# Patient Record
Sex: Male | Born: 1955
Health system: Southern US, Community
[De-identification: ages and names within clinical notes are randomized; demographics above are authoritative.]

## PROBLEM LIST (undated history)

## (undated) DIAGNOSIS — E785 Hyperlipidemia, unspecified: Secondary | ICD-10-CM

## (undated) DIAGNOSIS — N529 Male erectile dysfunction, unspecified: Secondary | ICD-10-CM

## (undated) DIAGNOSIS — J329 Chronic sinusitis, unspecified: Secondary | ICD-10-CM

## (undated) DIAGNOSIS — R739 Hyperglycemia, unspecified: Secondary | ICD-10-CM

## (undated) DIAGNOSIS — F329 Major depressive disorder, single episode, unspecified: Secondary | ICD-10-CM

## (undated) DIAGNOSIS — Z889 Allergy status to unspecified drugs, medicaments and biological substances status: Secondary | ICD-10-CM

## (undated) DIAGNOSIS — I639 Cerebral infarction, unspecified: Secondary | ICD-10-CM

## (undated) DIAGNOSIS — M21619 Bunion of unspecified foot: Secondary | ICD-10-CM

## (undated) DIAGNOSIS — J189 Pneumonia, unspecified organism: Secondary | ICD-10-CM

## (undated) HISTORY — DX: Hyperlipidemia, unspecified: E78.5

## (undated) HISTORY — DX: Male erectile dysfunction, unspecified: N52.9

## (undated) HISTORY — DX: Pneumonia, unspecified organism: J18.9

## (undated) HISTORY — PX: SHOULDER ARTHROSCOPY WITH BICEPS TENDON REPAIR: SHX5674

## (undated) HISTORY — PX: KNEE ARTHROSCOPY: SHX127

## (undated) HISTORY — DX: Chronic sinusitis, unspecified: J32.9

## (undated) HISTORY — DX: Hyperglycemia, unspecified: R73.9

## (undated) HISTORY — DX: Bunion of unspecified foot: M21.619

## (undated) HISTORY — DX: Allergy status to unspecified drugs, medicaments and biological substances: Z88.9

## (undated) HISTORY — PX: ADENOIDECTOMY: SUR15

## (undated) HISTORY — DX: Major depressive disorder, single episode, unspecified: F32.9

## (undated) HISTORY — PX: TONSILLECTOMY: SUR1361

## (undated) HISTORY — PX: APPENDECTOMY: SHX54

---

## 2005-04-30 ENCOUNTER — Ambulatory Visit (HOSPITAL_COMMUNITY): Admission: RE | Admit: 2005-04-30 | Discharge: 2005-04-30 | Payer: Self-pay | Admitting: Specialist

## 2012-08-04 ENCOUNTER — Other Ambulatory Visit: Payer: Self-pay | Admitting: Physical Medicine and Rehabilitation

## 2012-08-04 ENCOUNTER — Encounter: Payer: Self-pay | Admitting: Physical Medicine and Rehabilitation

## 2012-08-04 ENCOUNTER — Encounter: Payer: Self-pay | Admitting: *Deleted

## 2012-08-04 ENCOUNTER — Inpatient Hospital Stay (HOSPITAL_COMMUNITY)
Admission: RE | Admit: 2012-08-04 | Discharge: 2012-08-11 | DRG: 945 | Disposition: A | Discharge status: Home / Self Care | Payer: 59 | Source: Ambulatory Visit | Attending: Physical Medicine & Rehabilitation | Admitting: Physical Medicine & Rehabilitation

## 2012-08-04 DIAGNOSIS — I10 Essential (primary) hypertension: Secondary | ICD-10-CM

## 2012-08-04 DIAGNOSIS — I6330 Cerebral infarction due to thrombosis of unspecified cerebral artery: Secondary | ICD-10-CM

## 2012-08-04 DIAGNOSIS — I639 Cerebral infarction, unspecified: Secondary | ICD-10-CM

## 2012-08-04 DIAGNOSIS — Z5189 Encounter for other specified aftercare: Principal | ICD-10-CM

## 2012-08-04 DIAGNOSIS — G8190 Hemiplegia, unspecified affecting unspecified side: Secondary | ICD-10-CM | POA: Diagnosis present

## 2012-08-04 DIAGNOSIS — I6350 Cerebral infarction due to unspecified occlusion or stenosis of unspecified cerebral artery: Secondary | ICD-10-CM | POA: Diagnosis present

## 2012-08-04 DIAGNOSIS — E785 Hyperlipidemia, unspecified: Secondary | ICD-10-CM

## 2012-08-04 LAB — COMPREHENSIVE METABOLIC PANEL
BUN: 12 mg/dL (ref 6–23)
Calcium: 9.6 mg/dL (ref 8.4–10.5)
GFR calc Af Amer: 82 mL/min — ABNORMAL LOW (ref >90)
Glucose, Bld: 93 mg/dL (ref 70–99)
Sodium: 138 mEq/L (ref 135–145)
Total Protein: 7.3 g/dL (ref 6.0–8.3)

## 2012-08-04 LAB — CBC
HCT: 46.5 % (ref 39.0–52.0)
Hemoglobin: 16 g/dL (ref 13.0–17.0)
WBC: 7.8 10*3/uL (ref 4.0–10.5)

## 2012-08-04 MED ORDER — FLEET ENEMA 7-19 GM/118ML RE ENEM: 1.0000 | ENEMA | Freq: Once | RECTAL | Status: AC | PRN

## 2012-08-04 MED ORDER — ASPIRIN EC 325 MG PO TBEC
325.0000 mg | DELAYED_RELEASE_TABLET | Freq: Every day | ORAL | Status: DC
  Administered 2012-08-05 – 2012-08-11 (×7): 325 mg via ORAL
  Filled 2012-08-04 (×8): qty 1

## 2012-08-04 MED ORDER — ROSUVASTATIN CALCIUM 10 MG PO TABS
10.0000 mg | ORAL_TABLET | Freq: Every day | ORAL | Status: DC
  Administered 2012-08-04 – 2012-08-10 (×7): 10 mg via ORAL
  Filled 2012-08-04 (×8): qty 1

## 2012-08-04 MED ORDER — TRAZODONE HCL 50 MG PO TABS
25.0000 mg | ORAL_TABLET | Freq: Every evening | ORAL | Status: DC | PRN
  Administered 2012-08-06: 25 mg via ORAL
  Administered 2012-08-06 – 2012-08-09 (×4): 50 mg via ORAL
  Filled 2012-08-04 (×5): qty 1

## 2012-08-04 MED ORDER — PROCHLORPERAZINE 25 MG RE SUPP
12.5000 mg | Freq: Four times a day (QID) | RECTAL | Status: DC | PRN
  Filled 2012-08-04: qty 1

## 2012-08-04 MED ORDER — GUAIFENESIN-DM 100-10 MG/5ML PO SYRP: 5.0000 mL | ORAL_SOLUTION | Freq: Four times a day (QID) | ORAL | Status: DC | PRN

## 2012-08-04 MED ORDER — PROCHLORPERAZINE MALEATE 5 MG PO TABS
5.0000 mg | ORAL_TABLET | Freq: Four times a day (QID) | ORAL | Status: DC | PRN
  Filled 2012-08-04: qty 2

## 2012-08-04 MED ORDER — BISACODYL 10 MG RE SUPP: 10.0000 mg | Freq: Every day | RECTAL | Status: DC | PRN

## 2012-08-04 MED ORDER — PROCHLORPERAZINE EDISYLATE 5 MG/ML IJ SOLN
5.0000 mg | Freq: Four times a day (QID) | INTRAMUSCULAR | Status: DC | PRN
  Filled 2012-08-04: qty 2

## 2012-08-04 MED ORDER — NON FORMULARY: 10.0000 mg | Freq: Every day | Status: DC

## 2012-08-04 MED ORDER — DIPHENHYDRAMINE HCL 12.5 MG/5ML PO ELIX
12.5000 mg | ORAL_SOLUTION | Freq: Four times a day (QID) | ORAL | Status: DC | PRN
  Administered 2012-08-06: 25 mg via ORAL
  Filled 2012-08-04 (×2): qty 10

## 2012-08-04 MED ORDER — ALUM & MAG HYDROXIDE-SIMETH 200-200-20 MG/5ML PO SUSP: 30.0000 mL | ORAL | Status: DC | PRN

## 2012-08-04 MED ORDER — ACETAMINOPHEN 325 MG PO TABS
325.0000 mg | ORAL_TABLET | ORAL | Status: DC | PRN
  Administered 2012-08-05 – 2012-08-10 (×9): 650 mg via ORAL
  Filled 2012-08-04 (×10): qty 2

## 2012-08-04 MED ORDER — FLUOXETINE HCL 20 MG PO CAPS
20.0000 mg | ORAL_CAPSULE | Freq: Every day | ORAL | Status: DC
  Administered 2012-08-05 – 2012-08-11 (×7): 20 mg via ORAL
  Filled 2012-08-04 (×9): qty 1

## 2012-08-04 MED ORDER — LISINOPRIL 10 MG PO TABS
10.0000 mg | ORAL_TABLET | Freq: Every day | ORAL | Status: DC
  Administered 2012-08-05 – 2012-08-11 (×7): 10 mg via ORAL
  Filled 2012-08-04 (×9): qty 1

## 2012-08-04 MED ORDER — ENOXAPARIN SODIUM 40 MG/0.4ML ~~LOC~~ SOLN
40.0000 mg | SUBCUTANEOUS | Status: DC
  Administered 2012-08-05 – 2012-08-11 (×7): 40 mg via SUBCUTANEOUS
  Filled 2012-08-04 (×8): qty 0.4

## 2012-08-04 MED ORDER — POLYETHYLENE GLYCOL 3350 17 G PO PACK
17.0000 g | PACK | Freq: Every day | ORAL | Status: DC | PRN
  Filled 2012-08-04: qty 1

## 2012-08-04 NOTE — Plan of Care (Addendum)
Overall Plan of Care [facility]) Patient Details Name: Ricardo Murphy MRN: [id number] DOB: 09-04-1955  Diagnosis: Rehab for Right frontoparietal infarct  Primary Diagnosis:    CVA (cerebral infarction) Co-morbidities: recent pneumonia, hx bicipital tendon repair  Functional Problem List  Patient demonstrates impairments in the following areas: Balance, Medication Management, Motor, Pain, Sensory  and Skin Integrity  Basic ADL's: bathing, dressing and toileting Advanced ADL's: simple meal preparation and light housekeeping  Transfers:  bed mobility, bed to chair, toilet, tub/shower, car and furniture Locomotion:  ambulation, wheelchair mobility and stairs  Additional Impairments:  Functional use of upper extremity  Anticipated Outcomes Item Anticipated Outcome  Eating/Swallowing  independent  Basic self-care  Mod I  Tolieting  Mod I  Bowel/Bladder  Pt continent of bowel and bladder  Transfers  Mod I  Locomotion  Supervision  Communication  Mod I  Cognition  Mod I  Pain  Pt denies pain  Safety/Judgment    Other  Skin will remain intact   Therapy Plan: PT Frequency: 1-2 X/day, 60-90 minutes OT Frequency: 1-2 X/day, 60-90 minutes;5 out of 7 days     Team Interventions: Item RN PT OT SLP SW TR Other  Self Care/Advanced ADL Retraining  x x      Neuromuscular Re-Education  x x      Therapeutic Activities  x x      UE/LE Strength Training/ROM  x x      UE/LE Coordination Activities  x x      Visual/Perceptual Remediation/Compensation         DME/Adaptive Equipment Instruction  x x      Therapeutic Exercise  x x      Balance/Vestibular Training  x x      Patient/Family Education  x x      Cognitive Remediation/Compensation    x     Functional Mobility Training  x x      Ambulation/Gait Training  x       Museum/gallery curator  x       Wheelchair Propulsion/Positioning  x       Functional Statistician  x       Community Reintegration  x        Dysphagia/Aspiration Film/video editor    x     Bladder Management         Bowel Management         Disease Management/Prevention  x       Pain Management x x       Medication Management x        Skin Care/Wound Management         Splinting/Orthotics  x       Discharge Planning  x x      Psychosocial Support x x x                         Team Discharge Planning: Destination:  Home Projected Follow-up:  PT, OT and Home Health Projected Equipment Needs:  Tub Engineer, building services Patient/family involved in discharge planning:  Yes  MD ELOS: 2wks Medical Rehab Prognosis:  Good Assessment: 56 yo male with acute CVA now requiring 24/7 rehab RN and MD as well as CIR level PT,OT,SLP

## 2012-08-04 NOTE — H&P (Deleted)
  Physical Medicine and Rehabilitation Admission H&P    CC: Right sided weakness  HPI:  Mr. Ricardo Murphy is a 56 year old male with history of HTN who was admitted to [facility] on 08/02/12 with right sided weakness, difficulty speaking and numbness right side. MRI brain done revealing acute infarct right frontoparietal region. Carotid dopplers without stenosis. He was started on ASA for CVA prophylaxis. He continues to have right hemiparesis with sensory deficits and speech has improved. Patient was advanced to D3 thin liquids. He was noted to have elevated BP and ace added for control. Therapies ongoing and CIR recommended for progressive therapies.   Review of Systems  HENT: Negative for neck pain.   Eyes: Negative for blurred vision and double vision.  Respiratory: Negative for cough and shortness of breath.   Cardiovascular: Negative for chest pain and palpitations.  Gastrointestinal: Negative for heartburn, nausea and constipation.  Genitourinary: Negative for urgency and frequency.  Musculoskeletal: Negative for myalgias and back pain.  Neurological: Positive for sensory change, focal weakness and headaches.  Psychiatric/Behavioral: The patient has insomnia.        Social History:  Married. Independent PTA.  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 He  does not smoke. He drinks a beer every couple of months. He does not use any illicit drugs. He does not have any smokeless tobacco history on file.  Allergies  Allergen Reactions  . Atorvastatin Hives  . Zocor (Simvastatin) Hives     Home: Lives in one level home with   STE.   Functional History: Independent and working PTA.  Functional Status:  Mobility: CGA for sitting at EOB Mod assist X 2 for standing and weight bearing RLE/     ADL:    Cognition:         Physical Exam  Nursing note and vitals reviewed. Constitutional: He is oriented to person, place, and time. He appears well-developed and well-nourished.  HENT:  Head:  Normocephalic and atraumatic.  Neck: Normal range of motion. Neck supple.  Cardiovascular: Normal rate and regular rhythm.   Pulmonary/Chest: Effort normal and breath sounds normal.  Abdominal: Soft. Bowel sounds are normal.  Neurological: He is alert and oriented to person, place, and time.       Speech clear. Follows  Commands without difficulty. Right hemiparesis with sensory deficits.        Post Admission Physician Evaluation: 4. Neuropsych: This patient is capable of making decisions on his/her own behalf. 5. HTN: monitor with bid checks. Continue lisinopril 6. Dyslipidemia: crestor started today. Will monitor for tolerance/SE.     08/04/2012

## 2012-08-04 NOTE — PMR Pre-admission (Signed)
[employer] PMR Admission Coordinator Pre-Admission Assessment  Patient: Ricardo Murphy is an 56 y.o., male MRN: [id number] DOB: [birth year] Height: 5\' 11"  (180.3 cm) Weight: 115.667 kg (255 lb)  Insurance Information HMO:  PPO:   PCP:    IPA:     80/20:      OTHER:  POS PRIMARY: UHC      Policy#: [id number]      Subscriber: pt CM Name: Pennelope Bracken     Phone#: [phone]     Fax#:   Pre-Cert#: [id number]      Employer: [employer] Benefits:  Phone #: [phone]     Name: Barnett Applebaum. Date: 08-26-11     Deduct: $1750.00 [$244.01 met]      Out of Pocket Max: $5000.00 [$027.25 met]      Life Max: 0 CIR: 80/20% after deduct & w/ preauth      SNF: 80/20% after deduct & w/ preauth [60 days-then medical necessity] Outpatient: 80% after deduct     Co-Pay: 20% 20 PT-20 OT ?preauth ST Home Health: 80% after deduct & w/ preauth    Co-Pay: 20% [100 visit limit] DME: 80% after deduct     Co-Pay: 20% Providers: [facility] network    Emergency Contact Information Contact Information    Name Relation Home Work Mobile   [place]  [phone]  [phone]      Current Medical History  Patient Admitting Diagnosis: L. CVA  [L. frontal parietal w/out hemorrhage] History of Present Illness: 56yo Caucasian male w/ h/o obesity, dyslipidemia, possible MI when 56yo [pt vague on this point].  Does not have PCP.  Pt claims to have been in excellent health till @ 0200 08/02/12.  He woke feeling numb, like his R LE was asleep.  Was able to get in the shower, where he could not use his right arm properly, and, he noted that his L LE was also weak.  Also, noted that his tongue felt very thick & he had difficulty speaking.  He did not notice any swallowing problems.  He had no difficulty breathing, no c/o chest pain or abdominal pain, no n/v or diarrhea.  No seizure activity reported. No B/B incontinence. With assistance he made it back to bed.  He had very little use of R arm or leg, though, they were not flaccid.  At  this point EMS was called.  He was admitted to [facility].  PT, OT, ST evaluations have been completed w/ recommendations of CIR.   Past Medical History  Dyslipidemia, obesity, possible CAD.  S/P appendectomy, shoulder surg, & R. Knee arthroscopic procedure. Nonsmoker, no alcohol or illicit drugs. Family History  Father w/ HTN-died in his early 27s from complications of CVA.  Mother w/ valvular heart disease, breast cancer, DM & HTN.  Prior Rehab/Hospitalizations: OP therapy after shoulder surgery-some years ago.   Current Medications  Allergies: hives w/ atorvastatin & simvastatin Aspirin, lovenox, lisinopril, crestor Doren Custard for rash] ,prozac  Patients Current Diet:  Mech soft thin liquids  Precautions / Restrictions Precautions Precautions: Fall Restrictions Weight Bearing Restrictions: No   Prior Activity Level Community (5-7x/wk): yes Journalist, newspaper / Equipment Home Assistive Devices/Equipment: None   Prior Functional Level Current Functional Level  Bed Mobility  Independent  Mod assist   Transfers  Independent  Total assist   Mobility - Walk/Wheelchair  Independent  Total assist   Upper Body Dressing  Independent  Max assist   Lower Body Dressing  Independent  Max assist  Grooming  Independent      Eating/Drinking  Independent  Min Chemical engineer  Independent  Total assist   Bladder Continence   continent      Bowel Management  continent      Stair Climbing  Able to Take Stairs?: Yes      Communication  Independent  delayed processing   Memory  no problems noted      Cooking/Meal Prep         Housework       Money Management       Driving  Yes     Previous Home Environment Living Arrangements: Spouse/significant other Lives With: Spouse Available Help at Discharge: Family Type of Home: House Home Layout: One level (does have basement-pt doesn't need to access) Home Access: Stairs to  enter Entrance Stairs-Number of Steps: 5  Discharge Living Setting Plans for Discharge Living Setting: Patient's home Do you have any problems obtaining your medications?: No (didn't take any meds)  Social/Family/Support Systems Patient Roles: Spouse Contact Information: pt's cell [phone] Anticipated Caregiver: wife, Vickey Huger Anticipated Caregiver's Contact Information: wife's cell [phone] Caregiver Availability: 24/7 (wife works-will arrange coverage) Discharge Plan Discussed with Primary Caregiver: Yes Is Caregiver In Agreement with Plan?: Yes Does Caregiver/Family have Issues with Lodging/Transportation while Pt is in Rehab?: No  Goals/Additional Needs Patient/Family Goal for Rehab: Mod I -S Expected length of stay: 7-14 days Pt/Family Agrees to Admission and willing to participate: Yes Program Orientation Provided & Reviewed with Pt/Caregiver Including Roles  & Responsibilities: Yes  Patient Condition: Rehab physician reviewed information faxed today 08/04/12 from [facility] and agreed pt would be an appropriate admission to CIR.   Preadmission Screen Completed By:  Brock Ra, 08/04/2012 2:36 PM ______________________________________________________________________   Discussed status with Dr. Riley Kill on 08/04/12 at 2:45pm and received telephone approval for admission today.  Admission Coordinator:  Brock Ra, time 2:45pm/Date 08/04/12

## 2012-08-04 NOTE — H&P (Signed)
Physical Medicine and Rehabilitation Admission H&P  CC: Right sided weakness  HPI: Mr. Ricardo Murphy is a 56 year old male with history of HTN who was admitted to [facility] on 08/02/12 with right sided weakness, difficulty speaking and numbness right side. MRI brain done revealing acute infarct left frontoparietal region. Carotid dopplers without stenosis. He was started on ASA for CVA prophylaxis. He continues to have right hemiparesis with sensory deficits and speech has improved. Patient was advanced to D3 thin liquids. He was noted to have elevated BP and ace added for control. Therapies ongoing and CIR recommended for progressive therapies.  Review of Systems  HENT: Negative for neck pain.  Eyes: Negative for blurred vision and double vision.  Respiratory: Negative for cough and shortness of breath.  Cardiovascular: Negative for chest pain and palpitations.  Gastrointestinal: Negative for heartburn, nausea and constipation.  Genitourinary: Negative for urgency and frequency.  Musculoskeletal: Negative for myalgias and back pain.  Neurological: Positive for sensory change, focal weakness and headaches.  Psychiatric/Behavioral: The patient has insomnia.   Past Medical History   Diagnosis  Date   .  Multiple allergies    .  Dyslipidemia    .  Sinus infection      a couple of months ago   .  Pneumonia      4 weeks ago    Past Surgical History   Procedure  Date   .  Appendectomy    .  Knee arthroscopy      left   .  Shoulder arthroscopy with biceps tendon repair      right    Family History   Problem  Relation  Age of Onset   .  Cancer - Other  Mother    .  Diabetes  Mother    .  Valvular heart disease  Mother    .  CVA  Father     Social History: Married. Independent PTA. 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 He drinks a beer every couple of months. He does not use any illicit drugs. He does not have any smokeless tobacco history on file.  Allergies   Allergen  Reactions   .   Atorvastatin  Hives   .  Zocor (Simvastatin)  Hives     (Not in a hospital admission)  Home:  Lives in one level home with STE.  Functional History:  Independent and working PTA.  Functional Status:  Mobility:  CGA for sitting at EOB  Mod assist X 2 for standing and weight bearing RLE/  ADL:   Cognition:    There were no vitals taken for this visit.  Physical Exam  Nursing note and vitals reviewed.  Constitutional: He is oriented to person, place, and time. He appears well-developed and well-nourished. Large frame HENT: oral mucosa pink and moist. Head: Normocephalic and atraumatic.  Neck: Normal range of motion. Neck supple.  Cardiovascular: Normal rate and regular rhythm. No murmur. Pulmonary/Chest: Effort normal and breath sounds normal. No wheezes, rales, or rhonchi.  Abdominal: Soft. Bowel sounds are normal.  Neurological: He is alert and oriented to person, place, and time.  Speech relatively clear. Mild right central 7.  Follows Commands without difficulty. Seems to have reasonable insight and awareness. Occasional word finding deficits.  Right UE is 2/5 deltoid, 3-4 at bicep, tricep, 4- hand intrinsics. RLE is 2-/5 HF, 2-/5 KE, 3/5 at ADF, APF. Minimal sensory deficits. Good sitting balance  No results found for this or any  previous visit (from the past 48 hour(s)).  No results found.  Post Admission Physician Evaluation:  1. Functional deficits secondary to left frontoparietal infarct with right hemiparesis. 2. Patient is admitted to receive collaborative, interdisciplinary care between the physiatrist, rehab nursing staff, and therapy team. 3. Patient's level of medical complexity and substantial therapy needs in context of that medical necessity cannot be provided at a lesser intensity of care such as a SNF. 4. Patient has experienced substantial functional loss from his/her baseline which was documented above under the "Functional History" and "Functional Status"  headings. Judging by the patient's diagnosis, physical exam, and functional history, the patient has potential for functional progress which will result in measurable gains while on inpatient rehab. These gains will be of substantial and practical use upon discharge in facilitating mobility and self-care at the household level. 5. Physiatrist will provide 24 hour management of medical needs as well as oversight of the therapy plan/treatment and provide guidance as appropriate regarding the interaction of the two. 6. 24 hour rehab nursing will assist with bladder management, bowel management, safety, skin/wound care, disease management, medication administration, pain management and patient education and help integrate therapy concepts, techniques,education, etc. 7. PT will assess and treat for: Lower extremity strength, range of motion, stamina, balance, functional mobility, safety, adaptive techniques and equipment, NMR, education. Goals are: mod I to supervision. 8. OT will assess and treat for: ADL's, functional mobility, safety, upper extremity strength, adaptive techniques and equipment, NMR, education. Goals are: mod I to supervision. 9. SLP will assess and treat for: language. Goals are: mod I. 10. Case Management and Social Worker will assess and treat for psychological issues and discharge planning. 11. Team conference will be held weekly to assess progress toward goals and to determine barriers to discharge. 12. Patient will receive at least 3 hours of therapy per day at least 5 days per week. 13. ELOS: 2.5 weeks Prognosis: excellent Medical Problem List and Plan:  1. DVT Prophylaxis/Anticoagulation: Pharmaceutical: Lovenox  2. Pain Management: N/A  3. Mood: Was started on Prozac for depressed mood. He seems to have a positive outlook at current time. Will monitor for now.  4. Neuropsych: This patient is capable of making decisions on his/her own behalf.  5. HTN: monitor with bid checks.  Continue lisinopril  6. Dyslipidemia: crestor started today. Will monitor for tolerance/SE. 7. Stroke prophylaxis with ECASA 325mg  qday. Consider further stroke work up per neurology.   08/04/2012 Ivory Broad, MD

## 2012-08-04 NOTE — Progress Notes (Signed)
Pt admitted to room 4002 from [facility], pt alert and oriented, wife at bedside, pt and wife oriented to room, rehab unit, and safety plan, pt resting in bed with call bell in reach

## 2012-08-05 ENCOUNTER — Inpatient Hospital Stay (HOSPITAL_COMMUNITY): Payer: 59

## 2012-08-05 ENCOUNTER — Inpatient Hospital Stay (HOSPITAL_COMMUNITY): Payer: 59 | Admitting: Occupational Therapy

## 2012-08-05 ENCOUNTER — Inpatient Hospital Stay (HOSPITAL_COMMUNITY): Payer: Self-pay | Admitting: Speech Pathology

## 2012-08-05 ENCOUNTER — Inpatient Hospital Stay (HOSPITAL_COMMUNITY): Payer: 59 | Admitting: *Deleted

## 2012-08-05 DIAGNOSIS — I10 Essential (primary) hypertension: Secondary | ICD-10-CM

## 2012-08-05 DIAGNOSIS — I639 Cerebral infarction, unspecified: Secondary | ICD-10-CM

## 2012-08-05 DIAGNOSIS — I6330 Cerebral infarction due to thrombosis of unspecified cerebral artery: Secondary | ICD-10-CM

## 2012-08-05 DIAGNOSIS — E785 Hyperlipidemia, unspecified: Secondary | ICD-10-CM

## 2012-08-05 DIAGNOSIS — G8110 Spastic hemiplegia affecting unspecified side: Secondary | ICD-10-CM

## 2012-08-05 HISTORY — DX: Cerebral infarction, unspecified: I63.9

## 2012-08-05 HISTORY — DX: Essential (primary) hypertension: I10

## 2012-08-05 NOTE — Progress Notes (Signed)
Social Work Assessment and Plan Social Work Assessment and Plan  Patient Details  Name: Ricardo Murphy MRN: [id number] Date of Birth: Jan 15, 1956  Today's Date: 08/05/2012  Problem List:  Patient Active Problem List  Diagnosis  . CVA (cerebral infarction)  . HTN (hypertension)  . Dyslipidemia   Past Medical History:  Past Medical History  Diagnosis Date  . Multiple allergies   . Dyslipidemia   . Sinus infection     a couple of months ago  . Pneumonia     4 weeks ago   Past Surgical History:  Past Surgical History  Procedure Date  . Appendectomy   . Knee arthroscopy     left  . Shoulder arthroscopy with biceps tendon repair     right   Social History:  does not have a smoking history on file. He does not have any smokeless tobacco history on file. His alcohol and drug histories not on file.  Family / Support Systems Marital Status: Married Patient Roles: Spouse;Parent;Other (Comment) (Employee) Spouse/Significant Other: Lana [phone]-cell Children: 29yo son-local Anticipated Caregiver: Wife-Lana Ability/Limitations of Caregiver: Lana works swing shift for the [employer] Dept Caregiver Availability: Other (Comment) (Wife will arrange coverage for pt if needed) Family Dynamics: Close knit family-couple and grown son along with three small grandchidlren.  Pt reports they have many friends and church members who are supportive.  Social History Preferred language: English Religion:  Cultural Background: No issues Education: High School Read: Yes Write: Yes Employment Status: Employed Name of Employer: [employer] Return to Work Plans: Plans to return if able Fish farm manager Issues: No issues Guardian/Conservator: None-according to MD pt is capable of making his own decisions   Abuse/Neglect Physical Abuse: Denies Verbal Abuse: Denies Sexual Abuse: Denies Exploitation of patient/patient's resources: Denies Self-Neglect: Denies  Emotional Status Pt's  affect, behavior adn adjustment status: Pt is motivated to [employer].  He reports: " I am ususally the one helping others, not being helped."  Not sure if he likes that, difficulty asking for help.  He takes each day at a time and has a strong faith to get him through. Recent Psychosocial Issues: Other medical issues-realizes he needs to see a MD regularly Pyschiatric History: No history-recent los of a dear friend 9 weeks ago.  He reports like family but relies upon his faith.  Depression screen deferred due to not enough time with another therapy coming in.  Will monitor and come back to this. Substance Abuse History: No issues  Patient / Family Perceptions, Expectations & Goals Pt/Family understanding of illness & functional limitations: Pt and wife are able to explain his stroke and deficits.  He may be having more tests to check outhis heart and MRI since not done at [place].  He is agreeable to this to prevent another one.  He will doe what is needed to recover and regain his independence. Premorbid pt/family roles/activities: Husband, Father, grandfather, Employee, Home owner, Church member,.etc Anticipated changes in roles/activities/participation: resume Pt/family expectations/goals: Pt states: " I want to be able to do for myself and regain my independence, I'm not good at asking others for help."  Wife reports: " I will do what I need to do to help him."  [employer]: None Premorbid Home Care/DME Agencies: None Transportation available at discharge: [employer] referrals recommended: Support group (specify) (CVA Support group)  Discharge Planning Living Arrangements: Spouse/significant other Support Systems: Spouse/significant other;Children;Friends/neighbors;Church/faith community Type of Residence: Private residence Insurance Resources: Media planner (specify) Education officer, museum) Financial Resources:  Employment;Family Support Financial Screen Referred:  No Living Expenses: Lives with family Money Management: Patient;Spouse Do you have any problems obtaining your medications?: No Home Management: Both he and wife Patient/Family Preliminary Plans: Return home with wife, who can arrange care if necessary.  They have friends and church memebers willing to assist if needed. Social Work Anticipated Follow Up Needs: HH/OP;Support Group DC Planning Additional Notes/Comments: Needs a PCP prior to discharge-wife working on, will discuss when comes back  Clinical Impression Very pleasant gentleman who is motivated and willing to do what he can to improve.  He is encouraged by the progress he has made thus far.  Supportive wife who is handling the paperwork for disability and FMLA. Should do well here and be a short length of stay.  Lucy Chris 08/05/2012, 11:18 AM

## 2012-08-05 NOTE — Evaluation (Signed)
Physical Therapy Assessment and Plan  Patient Details  Name: Ricardo Murphy MRN: [id number] Date of Birth: 21-Feb-1956  PT Diagnosis: Abnormality of gait, Coordination disorder, Hemiplegia dominant and Impaired sensation Rehab Potential: Excellent ELOS: 2-2.5 weeks weeks (Pt dearly wants to be home for Christmas - very likely)   Today's Date: 08/05/2012 Time: 8469-6295 Time Calculation (min): 60 min  Problem List:  Patient Active Problem List  Diagnosis  . CVA (cerebral infarction)  . HTN (hypertension)  . Dyslipidemia    Past Medical History:  Past Medical History  Diagnosis Date  . Multiple allergies   . Dyslipidemia   . Sinus infection     a couple of months ago  . Pneumonia     4 weeks ago   Past Surgical History:  Past Surgical History  Procedure Date  . Appendectomy   . Knee arthroscopy     left  . Shoulder arthroscopy with biceps tendon repair     right    Assessment & Plan Clinical Impression: Mr. Ricardo Murphy is a 56 year old male with history of HTN who was admitted to [facility] on 08/02/12 with right sided weakness, difficulty speaking and numbness right side. MRI brain done revealing acute infarct left frontoparietal region. Carotid dopplers without stenosis. He was started on ASA for CVA prophylaxis. He continues to have right hemiparesis with sensory deficits and speech has improved. Patient was advanced to D3 thin liquids. He was noted to have elevated BP and ace added for control. Therapies ongoing and CIR recommended for progressive therapies.  Patient transferred to CIR on 08/04/2012 .   Patient currently requires max with mobility secondary to impaired timing and sequencing, unbalanced muscle activation, decreased coordination, decreased sitting balance, decreased standing balance, hemiplegia and decreased balance strategies.  Prior to hospitalization, patient was Independent with mobility and lived with Spouse in a House home.  Home access is 5Stairs to  enter.  Patient will benefit from skilled PT intervention to maximize safe functional mobility, minimize fall risk and decrease caregiver burden for planned discharge home with 24 hour supervision.  Anticipate patient will benefit from follow up HH at discharge.  PT - End of Session Activity Tolerance: Tolerates < 10 min activity, no significant change in vital signs Endurance Deficit: Yes Endurance Deficit Description: Pt needing sitting rest break x73min following stand-step transfer and static standing each due to fatigue. VSS PT Assessment Rehab Potential: Excellent Barriers to Discharge: None PT Plan PT Frequency: 1-2 X/day, 60-90 minutes Estimated Length of Stay: 2-2.5 weeks weeks (Pt dearly wants to be home for Christmas - very likely) PT Treatment/Interventions: Warden/ranger;Ambulation/gait training;Community reintegration;Discharge planning;Disease management/prevention;DME/adaptive equipment instruction;Functional electrical stimulation;Functional mobility training;Neuromuscular re-education;Pain management;Patient/family education;Psychosocial support;Splinting/orthotics;Stair training;Therapeutic Activities;Therapeutic Exercise;UE/LE Strength taining/ROM;UE/LE Coordination activities;Wheelchair propulsion/positioning PT Recommendation Recommendations for Other Services: Neuropsych consult Follow Up Recommendations: Home health PT;24 hour supervision/assistance Equipment Recommended: Rolling walker with 5" wheels  PT Evaluation Precautions/Restrictions Precautions Precautions: Fall Precaution Comments: R sided weakness Restrictions Weight Bearing Restrictions: No General  Vital Signs HR 92 bpm, 02 96% on room air following gait    Pain Pain Assessment Pain Assessment: 0-10 Pain Score:   7 Pain Type: Acute pain Pain Location: Head Pain Orientation: Right Pain Descriptors: Aching Pain Onset: Sudden Pain Intervention(s): Medication (See eMAR) Home  Living/Prior Functioning Home Living Lives With: Spouse Available Help at Discharge: Family;Other (Comment) (Son) Type of Home: House Home Access: Stairs to enter [employer] of Steps: 5 Entrance Stairs-Rails: Can reach both Home Layout: Other (Comment);One level ((  does have basement-pt doesn't need to access) Bathroom Shower/Tub: Tub/shower unit;Curtain Bathroom Toilet: Standard Home Adaptive Equipment: None Prior Function Level of Independence: Independent with basic ADLs;Independent with gait;Independent with homemaking with ambulation;Independent with transfers Able to Take Stairs?: Yes Driving: Yes Vocation: Full time employment Vocation Requirements: truck driver Leisure: Hobbies-yes (Comment) Comments: goes to car shows Vision/Perception  Vision - History Baseline Vision: Wears glasses only for reading Patient Visual Report: No change from baseline Vision - Assessment Eye Alignment: Within Functional Limits Perception Perception: Within Functional Limits Praxis Praxis: Intact  Cognition Overall Cognitive Status: Appears within functional limits for tasks assessed Arousal/Alertness: Awake/alert Orientation Level: Oriented X4 Safety/Judgment: Appears intact Sensation Sensation Light Touch: Impaired by gross assessment (Decreased LLE, but present) Stereognosis: Appears Intact Hot/Cold: Appears Intact Proprioception: Appears Intact Coordination Gross Motor Movements are Fluid and Coordinated: No Fine Motor Movements are Fluid and Coordinated: No Coordination and Movement Description: Decreased strength and timing with RLE Heel Shin Test: Unable to complete with RLE due to weakness Motor  Motor Motor: Hemiplegia Motor - Skilled Clinical Observations: RLE hemiplegia, strength greater distal>proximal  Mobility Bed Mobility Bed Mobility: Not assessed Transfers Sit to Stand: 3: Mod assist;With upper extremity assist;From chair/3-in-1 Sit to Stand  Details: Tactile cues for weight shifting;Verbal cues for sequencing;Verbal cues for technique;Manual facilitation for weight shifting;Manual facilitation for placement Sit to Stand Details (indicate cue type and reason): Cues for scooting, engaging RLE, and lifting assist Stand to Sit: 4: Min assist;With upper extremity assist;To chair/3-in-1 Stand to Sit Details (indicate cue type and reason): Verbal cues for sequencing;Verbal cues for technique;Verbal cues for precautions/safety Stand to Sit Details: Steadying assist to control descent Stand Pivot Transfers: 2: Max assist;With Sales promotion account executive Details: Tactile cues for weight shifting;Visual cues/gestures for precautions/safety;Verbal cues for sequencing;Verbal cues for technique;Verbal cues for precautions/safety;Verbal cues for gait pattern;Verbal cues for safe use of DME/AE;Manual facilitation for weight shifting;Manual facilitation for placement;Manual facilitation for weight bearing Stand Pivot Transfer Details (indicate cue type and reason): Stand-step transfer with Max A for steadying  Locomotion  Ambulation Ambulation: Yes Ambulation/Gait Assistance: 1: +2 Total assist Ambulation Distance (Feet): 20 Feet Assistive device: 2 person hand held assist Ambulation/Gait Assistance Details: Tactile cues for weight shifting;Tactile cues for posture;Verbal cues for sequencing;Verbal cues for technique;Verbal cues for precautions/safety;Verbal cues for gait pattern;Manual facilitation for weight shifting;Manual facilitation for placement;Manual facilitation for weight bearing Ambulation/Gait Assistance Details: Pt demonstrates good stability with R stance, but decreased ability to fully advance RLE due to weakness. Pt needing assist to complete RLE swing through for 10% of steps, but drags R toe through remainder of trials due to hip, knee, and ankle weakness. Pt needing Mod A for weight shifting, but not pressing into either therapist  tremendously. Distance limited by fatigue. Pt needing cues for posture as he leans quite forward to see feet. R step somewhat improved with posture.  Stairs / Additional Locomotion Stairs: Yes Stairs Assistance: 3: Mod assist Stairs Assistance Details: Visual cues for safe use of DME/AE;Visual cues/gestures for precautions/safety;Verbal cues for sequencing;Verbal cues for technique;Verbal cues for precautions/safety;Verbal cues for gait pattern;Verbal cues for safe use of DME/AE;Manual facilitation for weight shifting;Manual facilitation for placement Stairs Assistance Details (indicate cue type and reason): Steadying assist needed, primarily with descent, as well as lifting assist for RLE safety. Heavy reliance on UEs for support.  Stair Management Technique: Two rails;Step to pattern;Forwards Number of Stairs: 3  Height of Stairs: 6  Wheelchair Mobility Wheelchair Mobility: Yes Wheelchair Assistance: 4: Min  assist Wheelchair Assistance Details: Verbal cues for safe use of DME/AE;Verbal cues for technique Wheelchair Propulsion: Both upper extremities Wheelchair Parts Management: Needs assistance Distance: 120  Trunk/Postural Assessment  Cervical Assessment Cervical Assessment: Within Functional Limits Thoracic Assessment Thoracic Assessment: Within Functional Limits Lumbar Assessment Lumbar Assessment: Within Functional Limits Postural Control Postural Control: Within Functional Limits  Balance Balance Balance Assessed: Yes Standardized Balance Assessment Standardized Balance Assessment: Berg Balance Test Berg Balance Test Standing Unsupported: Able to stand 30 seconds unsupported Sitting with Back Unsupported but Feet Supported on Floor or Stool: Able to sit safely and securely 2 minutes Stand to Sit: Uses backs of legs against chair to control descent Transfers: Needs one person to assist Dynamic Sitting Balance Dynamic Sitting - Balance Support: Left upper extremity  supported;Feet supported Dynamic Sitting - Level of Assistance: 4: Min assist Dynamic Sitting Balance - Compensations: Pt relies on LUE for support Static Standing Balance Static Standing - Balance Support: No upper extremity supported Static Standing - Level of Assistance: 3: Mod assist Static Standing - Comment/# of Minutes: Pt able to stand unsupported x33min with up to Mod A for steadying - visibly shaky throughout entire body Dynamic Standing Balance Dynamic Standing - Balance Support: Right upper extremity supported Dynamic Standing - Level of Assistance: 2: Max assist Dynamic Standing - Balance Activities: Lateral lean/weight shifting;Reaching for objects;Reaching across midline Extremity Assessment  RUE Assessment RUE Assessment: Exceptions to [facility] ( (2 previous shoulder surgeries)) RUE AROM (degrees) Right Shoulder Flexion: 85 Degrees RUE Strength RUE Overall Strength: Deficits ((2/5 shoulder; elbow, wrist, and hand WFL) LUE Assessment LUE Assessment: Within Functional Limits RLE Assessment RLE Assessment: Exceptions to [facility] RLE Strength RLE Overall Strength Comments: Hip, knee grossly 2/5, ankle 3/5  LLE Assessment LLE Assessment: Within Functional Limits  See FIM for current functional status Refer to Care Plan for Long Term Goals  Tx initiated this session for transfer training and standing tolerance for balance during functional activities. Attempted Berg balance test, but pt too fatigued to complete at this time.  Pt became tearful at beginning of session after talking on phone with co-workers, saddened by this unexpected change in functional status, esp after recent pneumonia.   Instructed pt in most efficient and safe technique for sit<>stand, stand-pivot and stand-step transfers with Mod>>Min A for standing and Max>>Mod A for stand-step for steadying during R step. Pt responding well to cues to increase R weight bearing during transfers as well as in standing with no  buckling, only increased shaking. Static standing 2x60 sec with Mod A for steadying due to shaking and cues for weight shifting and relaxing upper body as able.  Pt instructed to continue attempting LE strengthening in sitting between therapies.   Recommendations for other services: Neuropsych  Discharge Criteria: Patient will be discharged from PT if patient refuses treatment 3 consecutive times without medical reason, if treatment goals not met, if there is a change in medical status, if patient makes no progress towards goals or if patient is discharged from hospital.  The above assessment, treatment plan, treatment alternatives and goals were discussed and mutually agreed upon: by patient and by family  Luvern Mischke, Chrisandra Netters, PT, DPT  08/05/2012, 11:47 AM

## 2012-08-05 NOTE — Progress Notes (Signed)
Occupational Therapy Session Note  Patient Details  Name: Ricardo Murphy MRN: [id number] Date of Birth: May 13, 1956  Today's Date: 08/05/2012 Time: 7829-5621 Time Calculation (min): 29 min  Skilled Therapeutic Interventions/Progress Updates:    Worked on RUE shoulder strengthening incorporating therapy ball and the LUE as an active assist.  Began in supine having pt perform chest press holding therapy ball for 2 sets of 10 repetitions with min facilitation and min assist to achieve full elbow extension.  Progressed to shoulder flexion bilaterally in supine as well with min assist, 1 set of 10 repetitions.  Transitioned to sitting to work on shoulder flexion using ball as well.  Pt able to achieve and maintain full bilateral shoulder flexion, 2 sets of 5 repetitions, with mod instructional cueing to not hold his breath.  Min facilitation also needed to maintain right elbow extension after achieving shoulder flexion greater than 90 degrees.      Therapy Documentation Precautions:  Precautions Precautions: Fall Precaution Comments: R sided weakness Restrictions Weight Bearing Restrictions: No  Pain: Pain Assessment Pain Assessment: No/denies pain Pain Score: 0-No pain  See FIM for current functional status  Therapy/Group: Individual Therapy  Donnell Beauchamp OTR/L 08/05/2012, 3:00 PM

## 2012-08-05 NOTE — Care Management Note (Signed)
[facility]patient [facility] Individual Statement of Services  Patient Name:  Ricardo Murphy  Date:  08/05/2012  Welcome to the Inpatient [facility].  Our goal is to provide you with an individualized program based on your diagnosis and situation, designed to meet your specific needs.  With this comprehensive rehabilitation program, you will be expected to participate in at least 3 hours of rehabilitation therapies Monday-Friday, with modified therapy programming on the weekends.  Your rehabilitation program will include the following services:  Physical Therapy (PT), Occupational Therapy (OT), Speech Therapy (ST), 24 hour per day rehabilitation nursing, Therapeutic Recreaction (TR), Neuropsychology, Case Management (RN and Social Worker), Rehabilitation Medicine, Nutrition Services and Pharmacy Services  Weekly team conferences will be held on Wednesday to discuss your progress.  Your RN Case Designer, television/film set will talk with you frequently to get your input and to update you on team discussions.  Team conferences with you and your family in attendance may also be held.  Expected length of stay: 2 weeks  Overall anticipated outcome: mod/i-supervision level  Depending on your progress and recovery, your program may change.  Your RN Case Estate agent will coordinate services and will keep you informed of any changes.  Your RN Sports coach and SW names and contact numbers are listed  below.  The following services may also be recommended but are not provided by the Inpatient Rehabilitation Center:   Driving Evaluations  Home Health Rehabiltiation Services  Outpatient Rehabilitatation [facility]  Vocational Rehabilitation   Arrangements will be made to provide these services after discharge if needed.  Arrangements include referral to agencies that provide these services.  Your insurance has been verified to be:  [facility] Your primary doctor is:  None  Pertinent  information will be shared with your doctor and your insurance company.   Social Worker:  Dossie Der, [place] [phone]  Information discussed with and copy given to patient by: Lucy Chris, 08/05/2012, 10:54 AM

## 2012-08-05 NOTE — Consult Note (Signed)
NEURO HOSPITALIST CONSULT NOTE    Reason for Consult: 3mm left frontoparietal infarct   HPI:                                                                                                                                          Ricardo Murphy is an 56 y.o. male who on Monday morning noted decreased sensation of bilateral legs R>L. He was able to walk to the shower. During his  Shower he noted right leg and right arm weakness.  After getting out of the shower he noted dysarthria. He was brought to [facility] where he obtained a CT head showing no acute stroke.  MRI brain showed a 3mm acute infarct left frontoparietal lobe. Carotid dopplers showed no ICA stenosis.  Patient was later transferred to [facility] CIR.  Since admission patients speech has improved to baseline, he feels he continues to be weak on the right arm and leg but this also has improved. Neurology was asked to see patient to confirm stroke workup is fully obtained.   Past Medical History  Diagnosis Date  . Multiple allergies   . Dyslipidemia   . Sinus infection     a couple of months ago  . Pneumonia     4 weeks ago    Past Surgical History  Procedure Date  . Appendectomy   . Knee arthroscopy     left  . Shoulder arthroscopy with biceps tendon repair     right    Family History  Problem Relation Age of Onset  . Cancer - Other Mother   . Diabetes Mother   . Valvular heart disease Mother   . CVA Father      Social History:  does not have a smoking history on file. He does not have any smokeless tobacco history on file. His alcohol and drug histories not on file.  Allergies  Allergen Reactions  . Atorvastatin Hives  . Zocor (Simvastatin) Hives    MEDICATIONS:                                                                                                                     Prior to Admission:  Prescriptions prior to admission  Medication Sig Dispense Refill  . aspirin EC 325 MG  tablet Take 325 mg by mouth daily.      Marland Kitchen docusate sodium (COLACE) 100 MG capsule Take 100 mg by mouth 2 (two) times daily.      Marland Kitchen FLUoxetine (PROZAC) 20 MG capsule Take 20 mg by mouth daily.      Marland Kitchen lisinopril (PRINIVIL,ZESTRIL) 10 MG tablet Take 10 mg by mouth daily.      . rosuvastatin (CRESTOR) 10 MG tablet Take 10 mg by mouth at bedtime.       Scheduled:   . aspirin EC  325 mg Oral Daily  . enoxaparin (LOVENOX) injection  40 mg Subcutaneous Q24H  . FLUoxetine  20 mg Oral Daily  . lisinopril  10 mg Oral Daily  . rosuvastatin  10 mg Oral q1800  . [DISCONTINUED] NON FORMULARY 10 mg  10 mg Oral QPC supper     ROS:                                                                                                                                       History obtained from the patient  General ROS: negative for - chills, fatigue, fever, night sweats, weight gain or weight loss Psychological ROS: positive for -depression Ophthalmic ROS: negative for - blurry vision, double vision, eye pain or loss of vision ENT ROS: negative for - epistaxis, nasal discharge, oral lesions, sore throat, tinnitus or vertigo Allergy and Immunology ROS: negative for - hives or itchy/watery eyes Hematological and Lymphatic ROS: negative for - bleeding problems, bruising or swollen lymph nodes Endocrine ROS: negative for - galactorrhea, hair pattern changes, polydipsia/polyuria or temperature intolerance Respiratory ROS: negative for - cough, hemoptysis, shortness of breath or wheezing Cardiovascular ROS: negative for - chest pain, dyspnea on exertion, edema or irregular heartbeat Gastrointestinal ROS: negative for - abdominal pain, diarrhea, hematemesis, nausea/vomiting or stool incontinence Genito-Urinary ROS: negative for - dysuria, hematuria, incontinence or urinary frequency/urgency Musculoskeletal ROS: negative for - joint swelling or muscular weakness Neurological ROS: as noted in HPI Dermatological ROS:  negative for rash and skin lesion changes   Blood pressure 123/69, pulse 75, temperature 98.3 F (36.8 C), temperature source Oral, SpO2 96.00%.   Neurologic Examination:                                                                                                       Mental Status: Alert, oriented, thought content appropriate.  Speech fluent without evidence of aphasia.  Able to follow 3 step commands without difficulty. Cranial  Nerves: II: Discs flat bilaterally; Visual fields grossly normal, pupils equal, round, reactive to light and accommodation III,IV, VI: ptosis not present, extra-ocular motions intact bilaterally V,VII: smile symmetric, facial light touch sensation normal bilaterally VIII: hearing normal bilaterally IX,X: gag reflex present XI: bilateral shoulder shrug XII: midline tongue extension Motor: Right : Upper extremity   4+/5    Left:     Upper extremity   5/5  Lower extremity   4/5      Lower extremity   5/5 --He shows significant give way weakness on upper extremity when testing strength and obtaining drift.  When arms are held out front he lowers his right shoulder but is able to hold his arm strongly antigravity. Hip flexion, knee extension and flexion are strong when he is distracted. Tone and bulk:normal tone throughout; no atrophy noted Sensory: Pinprick and light touch intact throughout, bilaterally Deep Tendon Reflexes: 2+ and symmetric throughout Plantars: Right: downgoing   Left: downgoing Cerebellar: normal finger-to-nose,  normal heel-to-shin test CV: pulses palpable throughout    No results found for this basename: cbc, bmp, coags, chol, tri, ldl, hga1c    Results for orders placed during the hospital encounter of 08/04/12 (from the past 48 hour(s))  COMPREHENSIVE METABOLIC PANEL     Status: Abnormal   Collection Time   08/04/12  5:55 PM      Component Value Range Comment   Sodium 138  135 - 145 mEq/L    Potassium 3.8  3.5 - 5.1 mEq/L     Chloride 100  96 - 112 mEq/L    CO2 29  19 - 32 mEq/L    Glucose, Bld 93  70 - 99 mg/dL    BUN 12  6 - 23 mg/dL    Creatinine, Ser 4.09  0.50 - 1.35 mg/dL    Calcium 9.6  8.4 - 81.1 mg/dL    Total Protein 7.3  6.0 - 8.3 g/dL    Albumin 3.9  3.5 - 5.2 g/dL    AST 29  0 - 37 U/L    ALT 29  0 - 53 U/L    Alkaline Phosphatase 48  39 - 117 U/L    Total Bilirubin 0.4  0.3 - 1.2 mg/dL    GFR calc non Af Amer 71 (*) >90 mL/min    GFR calc Af Amer 82 (*) >90 mL/min   CBC     Status: Normal   Collection Time   08/04/12  5:55 PM      Component Value Range Comment   WBC 7.8  4.0 - 10.5 K/uL    RBC 5.38  4.22 - 5.81 MIL/uL    Hemoglobin 16.0  13.0 - 17.0 g/dL    HCT 91.4  78.2 - 95.6 %    MCV 86.4  78.0 - 100.0 fL    MCH 29.7  26.0 - 34.0 pg    MCHC 34.4  30.0 - 36.0 g/dL    RDW 21.3  08.6 - 57.8 %    Platelets 265  150 - 400 K/uL   MRSA PCR SCREENING     Status: Normal   Collection Time   08/04/12  9:56 PM      Component Value Range Comment   MRSA by PCR NEGATIVE  NEGATIVE     No results found.   Assessment/Plan:  56 YO male with left frontoparietal infarct --MRI shows a small 3mm infarct which likely small vessel origin. His symptoms do not corrilate with the infarct  seen on MRI.  Will repeat MRI to confirm no further infarct has occurred.   Recommend: 1) 2 D echo, A1c, FLP 2) Repeat MRI head with MRA head 3) Continue with inpatient rehab.  4) Continue ASA daily as he was not on ASA prior to event.   Trayden Brandy PA-C [facility] [phone]  Patient was personally evaluated by me and agreement with exam findings, clinical assessment and management recommendations.  Venetia Maxon M.D. [facility] [phone]  08/05/2012, 10:56 AM

## 2012-08-05 NOTE — Evaluation (Signed)
Occupational Therapy Assessment and Plan  Patient Details  Name: Ricardo Murphy MRN: [id number] Date of Birth: [birth year]  OT Diagnosis: hemiplegia affecting dominant side and muscle weakness (generalized) Rehab Potential: Rehab Potential: Good ELOS: 2 weeks   Today's Date: 08/05/2012 Time: 0900-1000 Time Calculation (min): 60 min    Pt seen for initial evaluation and ADL retraining of bathing, toileting, and dressing.  Pt participated well and is highly motivated.  Overall min assist level.  Problem List:  Patient Active Problem List  Diagnosis  . CVA (cerebral infarction)  . HTN (hypertension)  . Dyslipidemia    Past Medical History:  Past Medical History  Diagnosis Date  . Multiple allergies   . Dyslipidemia   . Sinus infection     a couple of months ago  . Pneumonia     4 weeks ago   Past Surgical History:  Past Surgical History  Procedure Date  . Appendectomy   . Knee arthroscopy     left  . Shoulder arthroscopy with biceps tendon repair     right    Assessment & Plan Clinical Impression: Mr. Ricardo Murphy is a 56 year old male with history of HTN who was admitted to [facility] on 08/02/12 with right sided weakness, difficulty speaking and numbness right side. MRI brain done revealing acute infarct left frontoparietal region. Carotid dopplers without stenosis. He was started on ASA for CVA prophylaxis. He continues to have right hemiparesis with sensory deficits and speech has improved. Patient was advanced to D3 thin liquids. He was noted to have elevated BP and ace added for control. Therapies ongoing and CIR recommended for progressive therapies.  Patient transferred to CIR on 08/04/2012 .    Patient currently requires min with basic self-care skills secondary to muscle weakness, unbalanced muscle activation and decreased coordination and decreased sitting balance, decreased standing balance and hemiplegia.  Prior to hospitalization, patient was fully independent, working  as a Naval architect.  Patient will benefit from skilled intervention to increase independence with basic self-care skills prior to discharge home with care partner.  Anticipate patient will require intermittent supervision and follow up home health.  OT - End of Session Activity Tolerance: Tolerates 30+ min activity without fatigue Endurance Deficit: No OT Assessment Rehab Potential: Good Barriers to Discharge: None OT Plan OT Frequency: 1-2 X/day, 60-90 minutes;5 out of 7 days Estimated Length of Stay: 2 weeks OT Treatment/Interventions: Balance/vestibular training;Discharge planning;DME/adaptive equipment instruction;Functional mobility training;Neuromuscular re-education;Patient/family education;Therapeutic Exercise;Therapeutic Activities;Psychosocial support;Self Care/advanced ADL retraining;UE/LE Strength taining/ROM;UE/LE Coordination activities OT Recommendation Follow Up Recommendations: Home health OT Equipment Recommended: 3 in 1 bedside comode;Tub/shower bench  OT Evaluation Precautions/Restrictions  Precautions Precautions: Fall Precaution Comments: R sided weakness Restrictions Weight Bearing Restrictions: No  Pain Pain Assessment Pain Assessment: 0-10 Pain Score:   7 Pain Type: Acute pain Pain Location: Head Pain Orientation: Right Pain Descriptors: Aching Pain Onset: Sudden Pain Intervention(s): Medication (See eMAR) Home Living/Prior Functioning Home Living Lives With: Spouse Available Help at Discharge: Family;Other (Comment) (Son) Type of Home: House Home Access: Stairs to enter [employer] of Steps: 5 Entrance Stairs-Rails: Can reach both Home Layout: Other (Comment);One level ((does have basement-pt doesn't need to access) Bathroom Shower/Tub: Tub/shower unit;Curtain Firefighter: Standard Home Adaptive Equipment: None Prior Function Level of Independence: Independent with basic ADLs;Independent with gait;Independent with homemaking with  ambulation;Independent with transfers Able to Take Stairs?: Yes Driving: Yes Vocation: Full time employment Vocation Requirements: truck driver Leisure: Hobbies-yes (Comment) Comments: goes to car shows ADL Refer  to FIM   Vision/Perception  Vision - History Baseline Vision: Wears glasses only for reading Patient Visual Report: No change from baseline Vision - Assessment Eye Alignment: Within Functional Limits Perception Perception: Within Functional Limits Praxis Praxis: Intact  Cognition Overall Cognitive Status: Appears within functional limits for tasks assessed Arousal/Alertness: Awake/alert Orientation Level: Oriented X4 Safety/Judgment: Appears intact Sensation Sensation Light Touch: Impaired by gross assessment (Decreased LLE, but present) Stereognosis: Appears Intact Hot/Cold: Appears Intact Proprioception: Appears Intact Coordination Gross Motor Movements are Fluid and Coordinated: No Fine Motor Movements are Fluid and Coordinated: No Coordination and Movement Description: Decreased strength and timing with RLE Motor  Motor Motor: Hemiplegia Motor - Skilled Clinical Observations: RLE hemiplegia, strength greater distal>proximal Mobility  Refer to FIM    Trunk/Postural Assessment  Cervical Assessment Cervical Assessment: Within Functional Limits Thoracic Assessment Thoracic Assessment: Within Functional Limits Lumbar Assessment Lumbar Assessment: Within Functional Limits Postural Control Postural Control: Within Functional Limits  Balance Balance Balance Assessed: Yes Standardized Balance Assessment Standardized Balance Assessment: Berg Balance Test Berg Balance Test Standing Unsupported: Able to stand 30 seconds unsupported Sitting with Back Unsupported but Feet Supported on Floor or Stool: Able to sit safely and securely 2 minutes Stand to Sit: Uses backs of legs against chair to control descent Transfers: Needs one person to assist Dynamic  Sitting Balance Dynamic Sitting - Level of Assistance: 4: Min assist Static Standing Balance Static Standing - Level of Assistance: 3: Mod assist Dynamic Standing Balance Dynamic Standing - Level of Assistance: 1: +1 Total assist Extremity/Trunk Assessment RUE Assessment RUE Assessment: Exceptions to [facility] ( (2 previous shoulder surgeries)) RUE AROM (degrees) Right Shoulder Flexion: 85 Degrees RUE Strength RUE Overall Strength: Deficits ((2/5 shoulder; elbow, wrist, and hand WFL) LUE Assessment LUE Assessment: Within Functional Limits  See FIM for current functional status Refer to Care Plan for Long Term Goals  Recommendations for other services: None  Discharge Criteria: Patient will be discharged from OT if patient refuses treatment 3 consecutive times without medical reason, if treatment goals not met, if there is a change in medical status, if patient makes no progress towards goals or if patient is discharged from hospital.  The above assessment, treatment plan, treatment alternatives and goals were discussed and mutually agreed upon: by patient  [facility] 08/05/2012, 11:27 AM

## 2012-08-05 NOTE — Progress Notes (Signed)
Patient information reviewed and entered into eRehab system by Lakshmi Sundeen, RN, CRRN, PPS Coordinator.  Information including medical coding and functional independence measure will be reviewed and updated through discharge.    

## 2012-08-05 NOTE — Progress Notes (Signed)
Patient ID: Ricardo Murphy, male   DOB: [birth year], 56 y.o.   MRN: [id number] Subjective/Complaints: 56 year old male with history of HTN who was admitted to [facility] on 08/02/12 with right sided weakness, difficulty speaking and numbness right side. MRI brain done revealing acute infarct left frontoparietal region. Carotid dopplers without stenosis. He was started on ASA for CVA prophylaxis. He continues to have right hemiparesis with sensory deficits and speech has improved. Patient was advanced to D3 thin liquids. He was noted to have elevated BP and ace added for control No ECHO done  Felt down a couple days ago , brother cheered him up Review of Systems  Constitutional: Negative for fever.  Eyes: Negative for blurred vision.  Respiratory: Negative for cough.   Cardiovascular: Negative for leg swelling.  Musculoskeletal: Positive for joint pain.  Neurological: Positive for tingling, sensory change and focal weakness. Negative for speech change.  Psychiatric/Behavioral: Negative for depression.   Objective: Vital Signs: Blood pressure 123/69, pulse 75, temperature 98.3 F (36.8 C), temperature source Oral, SpO2 96.00%. No results found. Results for orders placed during the hospital encounter of 08/04/12 (from the past 72 hour(s))  COMPREHENSIVE METABOLIC PANEL     Status: Abnormal   Collection Time   08/04/12  5:55 PM      Component Value Range Comment   Sodium 138  135 - 145 mEq/L    Potassium 3.8  3.5 - 5.1 mEq/L    Chloride 100  96 - 112 mEq/L    CO2 29  19 - 32 mEq/L    Glucose, Bld 93  70 - 99 mg/dL    BUN 12  6 - 23 mg/dL    Creatinine, Ser 1.47  0.50 - 1.35 mg/dL    Calcium 9.6  8.4 - 82.9 mg/dL    Total Protein 7.3  6.0 - 8.3 g/dL    Albumin 3.9  3.5 - 5.2 g/dL    AST 29  0 - 37 U/L    ALT 29  0 - 53 U/L    Alkaline Phosphatase 48  39 - 117 U/L    Total Bilirubin 0.4  0.3 - 1.2 mg/dL    GFR calc non Af Amer 71 (*) >90 mL/min    GFR calc Af Amer 82 (*) >90 mL/min   CBC      Status: Normal   Collection Time   08/04/12  5:55 PM      Component Value Range Comment   WBC 7.8  4.0 - 10.5 K/uL    RBC 5.38  4.22 - 5.81 MIL/uL    Hemoglobin 16.0  13.0 - 17.0 g/dL    HCT 56.2  13.0 - 86.5 %    MCV 86.4  78.0 - 100.0 fL    MCH 29.7  26.0 - 34.0 pg    MCHC 34.4  30.0 - 36.0 g/dL    RDW 78.4  69.6 - 29.5 %    Platelets 265  150 - 400 K/uL   MRSA PCR SCREENING     Status: Normal   Collection Time   08/04/12  9:56 PM      Component Value Range Comment   MRSA by PCR NEGATIVE  NEGATIVE       Nursing note and vitals reviewed.  Constitutional: He is oriented to person, place, and time. He appears well-developed and well-nourished. Large frame  HENT: oral mucosa pink and moist.  Head: Normocephalic and atraumatic.  Neck: Normal range of motion. Neck supple.  Cardiovascular: Normal rate and regular rhythm. No murmur.  Pulmonary/Chest: Effort normal and breath sounds normal. No wheezes, rales, or rhonchi.  Abdominal: Soft. Bowel sounds are normal.  Neurological: He is alert and oriented to person, place, and time.  Speech clear. Mild right central 7. Follows Commands without difficulty.  reasonable insight and awareness. Occasional word finding deficits. Right UE is 2/5 deltoid, 3-4 at bicep, tricep, 3- hand intrinsics. RLE is 2-/5 HF, 2-/5 KE, 3/5 at ADF, APF. Minimal sensory deficits. Good sitting balance   Assessment/Plan: 1. Functional deficits secondary to Left frontoparietal infarct which require 3+ hours per day of interdisciplinary therapy in a comprehensive inpatient rehab setting. Physiatrist is providing close team supervision and 24 hour management of active medical problems listed below. Physiatrist and rehab team continue to assess barriers to discharge/monitor patient progress toward functional and medical goals. FIM:                                  Medical Problem List and Plan:  1. DVT Prophylaxis/Anticoagulation: Pharmaceutical:  Lovenox  2. Pain Management: N/A  3. Mood: Was started on Prozac for depressed mood. He seems to have a positive outlook at current time. Will monitor for now.  4. Neuropsych: This patient is capable of making decisions on his/her own behalf.  5. HTN: monitor with bid checks. Continue lisinopril  6. Dyslipidemia: crestor started today. Will monitor for tolerance/SE.  7. Stroke prophylaxis with ECASA 325mg  qday. Consider further stroke work up per neurology.    LOS (Days) 1 A FACE TO FACE EVALUATION WAS PERFORMED  Ricardo Murphy 08/05/2012, 7:13 AM

## 2012-08-06 ENCOUNTER — Inpatient Hospital Stay (HOSPITAL_COMMUNITY): Payer: 59 | Admitting: Occupational Therapy

## 2012-08-06 ENCOUNTER — Inpatient Hospital Stay (HOSPITAL_COMMUNITY): Payer: 59

## 2012-08-06 ENCOUNTER — Inpatient Hospital Stay (HOSPITAL_COMMUNITY): Payer: 59 | Admitting: Speech Pathology

## 2012-08-06 DIAGNOSIS — E785 Hyperlipidemia, unspecified: Secondary | ICD-10-CM

## 2012-08-06 DIAGNOSIS — I10 Essential (primary) hypertension: Secondary | ICD-10-CM

## 2012-08-06 DIAGNOSIS — I6350 Cerebral infarction due to unspecified occlusion or stenosis of unspecified cerebral artery: Secondary | ICD-10-CM

## 2012-08-06 DIAGNOSIS — I6789 Other cerebrovascular disease: Secondary | ICD-10-CM

## 2012-08-06 LAB — LIPID PANEL
LDL Cholesterol: 122 mg/dL — ABNORMAL HIGH (ref 0–99)
VLDL: 25 mg/dL (ref 0–40)

## 2012-08-06 LAB — HEMOGLOBIN A1C: Hgb A1c MFr Bld: 5.5 % (ref <5.7)

## 2012-08-06 MED ORDER — ALPRAZOLAM 0.25 MG PO TABS
0.2500 mg | ORAL_TABLET | Freq: Once | ORAL | Status: AC
  Administered 2012-08-06: 0.25 mg via ORAL
  Filled 2012-08-06: qty 1

## 2012-08-06 MED ORDER — ALPRAZOLAM 0.25 MG PO TABS
0.2500 mg | ORAL_TABLET | Freq: Two times a day (BID) | ORAL | Status: DC | PRN
  Administered 2012-08-07 – 2012-08-10 (×4): 0.25 mg via ORAL
  Filled 2012-08-06 (×4): qty 1

## 2012-08-06 MED ORDER — DIPHENHYDRAMINE HCL 25 MG PO CAPS
25.0000 mg | ORAL_CAPSULE | Freq: Every day | ORAL | Status: DC
  Administered 2012-08-06 – 2012-08-10 (×5): 25 mg via ORAL
  Filled 2012-08-06 (×7): qty 1

## 2012-08-06 NOTE — Progress Notes (Signed)
Patient ID: Ricardo Murphy, male   DOB: [birth year], 56 y.o.   MRN: [id number] Subjective/Complaints: 56 year old male with history of HTN who was admitted to [facility] on 08/02/12 with right sided weakness, difficulty speaking and numbness right side. MRI brain done revealing acute infarct left frontoparietal region. Carotid dopplers without stenosis. He was started on ASA for CVA prophylaxis. He continues to have right hemiparesis with sensory deficits and speech has improved. Patient was advanced to D3 thin liquids. He was noted to have elevated BP and ace added for control No ECHO done  Wife with pt.  Discussed MRI result. No new symptoms Review of Systems  Constitutional: Negative for fever.  Eyes: Negative for blurred vision.  Respiratory: Negative for cough.   Cardiovascular: Negative for leg swelling.  Musculoskeletal: Positive for joint pain.  Neurological: Positive for tingling, sensory change and focal weakness. Negative for speech change.  Psychiatric/Behavioral: Negative for depression.   Objective: Vital Signs: Blood pressure 115/72, pulse 71, temperature 98.6 F (37 C), temperature source Oral, SpO2 94.00%. [name]  08/05/2012  *RADIOLOGY REPORT*  Clinical Data:  Stroke.  Slurred speech  MRI HEAD WITHOUT CONTRAST MRA HEAD WITHOUT CONTRAST  Technique:  Multiplanar, multiecho pulse sequences of the brain and surrounding structures were obtained without intravenous contrast. Angiographic images of the head were obtained using MRA technique without contrast.  Comparison:  MRI 08/02/2012  MRI HEAD  Findings:  Small area restricted diffusion left parietal periventricular white matter is unchanged.  New area of restricted diffusion distal to this in the left parietal lobe compatible with acute infarct which was not present previously.  No other areas of acute infarct.  Mild chronic microvascular ischemia in the white matter.  Ventricle size is normal.  Negative for intracranial  hemorrhage or mass.  IMPRESSION: Compared with the prior MRI of 08/02/2012, there is a new area restricted diffusion in the left parietal lobe measuring under 1 cm compatible with acute infarct.  Left parietal white matter acute infarct again noted.  MRA HEAD  Findings:  Right vertebral artery is dominant.  Both vertebral arteries are patent to the basilar.  PICA is patent bilaterally. Fenestration of the proximal basilar is noted.  Basilar is widely patent.  Superior cerebellar and posterior cerebral arteries are patent bilaterally.  Internal carotid artery is widely patent bilaterally without stenosis.  Anterior and middle cerebral arteries are patent bilaterally.  Negative for cerebral aneurysm.  IMPRESSION:  Negative   Original Report Authenticated By: Janeece Riggers, M.D.    [name]  08/05/2012  *RADIOLOGY REPORT*  Clinical Data:  Stroke.  Slurred speech  MRI HEAD WITHOUT CONTRAST MRA HEAD WITHOUT CONTRAST  Technique:  Multiplanar, multiecho pulse sequences of the brain and surrounding structures were obtained without intravenous contrast. Angiographic images of the head were obtained using MRA technique without contrast.  Comparison:  MRI 08/02/2012  MRI HEAD  Findings:  Small area restricted diffusion left parietal periventricular white matter is unchanged.  New area of restricted diffusion distal to this in the left parietal lobe compatible with acute infarct which was not present previously.  No other areas of acute infarct.  Mild chronic microvascular ischemia in the white matter.  Ventricle size is normal.  Negative for intracranial hemorrhage or mass.  IMPRESSION: Compared with the prior MRI of 08/02/2012, there is a new area restricted diffusion in the left parietal lobe measuring under 1 cm compatible with acute infarct.  Left parietal white matter acute  infarct again noted.  MRA HEAD  Findings:  Right vertebral artery is dominant.  Both vertebral arteries are patent to the basilar.  PICA  is patent bilaterally. Fenestration of the proximal basilar is noted.  Basilar is widely patent.  Superior cerebellar and posterior cerebral arteries are patent bilaterally.  Internal carotid artery is widely patent bilaterally without stenosis.  Anterior and middle cerebral arteries are patent bilaterally.  Negative for cerebral aneurysm.  IMPRESSION:  Negative   Original Report Authenticated By: Janeece Riggers, M.D.    Results for orders placed during the hospital encounter of 08/04/12 (from the past 72 hour(s))  COMPREHENSIVE METABOLIC PANEL     Status: Abnormal   Collection Time   08/04/12  5:55 PM      Component Value Range Comment   Sodium 138  135 - 145 mEq/L    Potassium 3.8  3.5 - 5.1 mEq/L    Chloride 100  96 - 112 mEq/L    CO2 29  19 - 32 mEq/L    Glucose, Bld 93  70 - 99 mg/dL    BUN 12  6 - 23 mg/dL    Creatinine, Ser 5.78  0.50 - 1.35 mg/dL    Calcium 9.6  8.4 - 46.9 mg/dL    Total Protein 7.3  6.0 - 8.3 g/dL    Albumin 3.9  3.5 - 5.2 g/dL    AST 29  0 - 37 U/L    ALT 29  0 - 53 U/L    Alkaline Phosphatase 48  39 - 117 U/L    Total Bilirubin 0.4  0.3 - 1.2 mg/dL    GFR calc non Af Amer 71 (*) >90 mL/min    GFR calc Af Amer 82 (*) >90 mL/min   CBC     Status: Normal   Collection Time   08/04/12  5:55 PM      Component Value Range Comment   WBC 7.8  4.0 - 10.5 K/uL    RBC 5.38  4.22 - 5.81 MIL/uL    Hemoglobin 16.0  13.0 - 17.0 g/dL    HCT 62.9  52.8 - 41.3 %    MCV 86.4  78.0 - 100.0 fL    MCH 29.7  26.0 - 34.0 pg    MCHC 34.4  30.0 - 36.0 g/dL    RDW 24.4  01.0 - 27.2 %    Platelets 265  150 - 400 K/uL   MRSA PCR SCREENING     Status: Normal   Collection Time   08/04/12  9:56 PM      Component Value Range Comment   MRSA by PCR NEGATIVE  NEGATIVE   HEMOGLOBIN A1C     Status: Normal   Collection Time   08/05/12  1:54 PM      Component Value Range Comment   Hemoglobin A1C 5.5  <5.7 %    Mean Plasma Glucose 111  <117 mg/dL       Nursing note and vitals  reviewed.  Constitutional: He is oriented to person, place, and time. He appears well-developed and well-nourished. Large frame  HENT: oral mucosa pink and moist.  Head: Normocephalic and atraumatic.  Neck: Normal range of motion. Neck supple.  Cardiovascular: Normal rate and regular rhythm. No murmur.  Pulmonary/Chest: Effort normal and breath sounds normal. No wheezes, rales, or rhonchi.  Abdominal: Soft. Bowel sounds are normal.  Neurological: He is alert and oriented to person, place, and time.  Speech clear. Mild right central  7. Follows Commands without difficulty.  reasonable insight and awareness. Occasional word finding deficits. Right UE is 2/5 deltoid, 3-4 at bicep, tricep, 3- hand intrinsics. RLE is 2-/5 HF, 2-/5 KE, 3/5 at ADF, APF. Minimal sensory deficits. Good sitting balance   Assessment/Plan: 1. Functional deficits secondary to Left frontoparietal infarct which require 3+ hours per day of interdisciplinary therapy in a comprehensive inpatient rehab setting. Physiatrist is providing close team supervision and 24 hour management of active medical problems listed below. Physiatrist and rehab team continue to assess barriers to discharge/monitor patient progress toward functional and medical goals. FIM: FIM - Bathing Bathing Steps Patient Completed: Chest;Right Arm;Left Arm;Abdomen;Front perineal area;Buttocks;Right lower leg (including foot);Left upper leg;Right upper leg;Left lower leg (including foot) Bathing: 4: Steadying assist  FIM - Upper Body Dressing/Undressing Upper body dressing/undressing steps patient completed: Thread/unthread right sleeve of pullover shirt/dresss;Thread/unthread left sleeve of pullover shirt/dress;Pull shirt over trunk;Put head through opening of pull over shirt/dress Upper body dressing/undressing: 5: Set-up assist to: Obtain clothing/put away FIM - Lower Body Dressing/Undressing Lower body dressing/undressing steps patient completed:  Thread/unthread right underwear leg;Thread/unthread left underwear leg;Pull underwear up/down;Thread/unthread right pants leg;Thread/unthread left pants leg;Pull pants up/down;Don/Doff right shoe;Don/Doff left sock Lower body dressing/undressing: 4: Steadying Assist  FIM - Toileting Toileting steps completed by patient: Adjust clothing prior to toileting;Adjust clothing after toileting;Performs perineal hygiene Toileting: 4: Steadying assist  FIM - Diplomatic Services operational officer Devices: Grab bars Toilet Transfers: 4-To toilet/BSC: Min A (steadying Pt. > 75%);4-From toilet/BSC: Min A (steadying Pt. > 75%)  FIM - Bed/Chair Transfer Bed/Chair Transfer Assistive Devices: Arm rests Bed/Chair Transfer: 3: Chair or W/C > Bed: Mod A (lift or lower assist);2: Bed > Chair or W/C: Max A (lift and lower assist)  FIM - Locomotion: Wheelchair Distance: 120 Locomotion: Wheelchair: 2: Travels 50 - 149 ft with minimal assistance (Pt.>75%) FIM - Locomotion: Ambulation Locomotion: Ambulation Assistive Devices: Other (comment) (bil HHA) Ambulation/Gait Assistance: 1: +2 Total assist Locomotion: Ambulation: 1: Two helpers  Comprehension Comprehension Mode: Auditory Comprehension: 7-Follows complex conversation/direction: With no assist  Expression Expression Mode: Verbal Expression: 7-Expresses complex ideas: With no assist  Social Interaction Social Interaction: 7-Interacts appropriately with others - No medications needed.  Problem Solving Problem Solving: 7-Solves complex problems: Recognizes & self-corrects  Memory Memory: 7-Complete Independence: No helper  Medical Problem List and Plan:  1. DVT Prophylaxis/Anticoagulation: Pharmaceutical: Lovenox  2. Pain Management: N/A  3. Mood: Was started on Prozac for depressed mood. He seems to have a positive outlook at current time. Will monitor for now.  4. Neuropsych: This patient is capable of making decisions on his/her own  behalf.  5. HTN: monitor with bid checks. Continue lisinopril  6. Dyslipidemia: crestor started today. Will monitor for tolerance/SE.  7. Stroke recurrent but without new clinical symptoms while on prophylaxis with ECASA 325mg  qday. Repeat MRI demonstrated a new 1 cm infarct in the R parietal area compared to 12/9. ? Change to Plavix or Aggrenox    LOS (Days) 2 A FACE TO FACE EVALUATION WAS PERFORMED  KIRSTEINS,ANDREW E 08/06/2012, 7:14 AM

## 2012-08-06 NOTE — Evaluation (Signed)
Late Entry:  Assessment completed 12/12  Speech Language Pathology Assessment and Plan   Patient Details  Name: Ricardo Murphy MRN: [id number] Date of Birth: 29-May-1956  SLP Diagnosis:   mild cognitive-linguistic impairments Rehab Potential:  good for goals ELOS:   2 weeks  Today's Date: 08/06/2012 Time:  -  8:00-9:00  60 minutes  Problem List:  Patient Active Problem List  Diagnosis  . CVA (cerebral infarction)  . HTN (hypertension)  . Dyslipidemia   Past Medical History:  Past Medical History  Diagnosis Date  . Multiple allergies   . Dyslipidemia   . Sinus infection     a couple of months ago  . Pneumonia     4 weeks ago   Past Surgical History:  Past Surgical History  Procedure Date  . Appendectomy   . Knee arthroscopy     left  . Shoulder arthroscopy with biceps tendon repair     right    Assessment / Plan / Recommendation Clinical Impression   [name] is a 56 year old male with history of HTN and pna (approx. 4 weeks ago) who was admitted to [facility] on 08/02/12 with right sided weakness, difficulty speaking, and Rt. sided numbness. MRI revealed acute infarct left frontoparietal region. Transferred to CIR on 08/04/2012. He continues to have right hemiparesis with sensory deficits while speech has improved. Pt. was on thickened liquids at [facility], then advanced to D3 thin liquids.   Pt. seen for cognitive-linguistic and swallow evaluation. He is alert and cooperative at bedside, stating he is not at baseline but has improved significantly since stroke. Attention, awareness, problem solving, speech intelligibility, reading and, writing appear to be [facility] for the tasks assessed. Pt. presents with mild deficits in executive function (planning, organization), and word finding at the conversational level.  Pt. scored in the mild deficit range on the working memory subtest on Cognistat examination, however he functionally recalled events of week, names of therapists at previous  hospital, upcoming procedures, and new information from MD.  Pt. also seen for bedside swallow evaluation during breakfast. Pt. stated difficulty swallowing immediately after stroke at [facility] and was placed on thickened liquids (reported no MBS performed).  Oral and pharyngeal phase appear WFL with consistencies consumed, no s/s of aspiration observed. Recommend regular diet with thin liquids. No further f/u is recommended at this time for dysphagia treatment, however pt. would benefit from ST in CIR to address mild cognitive-linguistic deficits     SLP Assessment    Pt. Will benefit from skilled ST services   Recommendations       SLP Frequency 3 out of 7 days   SLP Treatment/Interventions   cognitive remediation, cueing hierarchy, environmental cues, functional tasks, patient education, speech/language facilitation, therapeutic activities   Pain: no pain    Short Term Goals: Week 1: SLP Short Term Goal 1 (Week 1): Pt. will implement word finding strategies in conversation with family and staff to convey thoughts with min verbal cues.Marland Kitchen SLP Short Term Goal 2 (Week 1): Pt. will demonstrate increased cognitive reorganization/executive functioning through functional tasks (i.e. making lists/schedules) with min verbal cues.   See FIM for current functional status Refer to Care Plan for Long Term Goals  Recommendations for other services: None  Discharge Criteria: Patient will be discharged from SLP if patient refuses treatment 3 consecutive times without medical reason, if treatment goals not met, if there is a change in medical status, if patient makes no progress towards goals or  if patient is discharged from hospital.  The above assessment, treatment plan, treatment alternatives and goals were discussed and mutually agreed upon: by patient  Royce Macadamia 08/06/2012, 8:41 AM

## 2012-08-06 NOTE — Progress Notes (Signed)
Occupational Therapy Session Note  Patient Details  Name: Ricardo Murphy MRN: [id number] Date of Birth: Jun 06, 1956  Today's Date: 08/06/2012 Time: 0900-1000 and 1100-1130 Time Calculation (min): 60 min and 30 min  Short Term Goals: Week 1:  OT Short Term Goal 1 (Week 1): Pt will bathe with supervision in shower. OT Short Term Goal 2 (Week 1): Pt will toilet with supervision. OT Short Term Goal 3 (Week 1): Pt will be able to actively lift RUE overhead.  Skilled Therapeutic Interventions/Progress Updates:    Visit 1:Pt seen for B/D at shower level with a focus on dynamic standing balance and use of RLE.  Pt ambulated in and out of shower with min assist without AD.  Pt completed all UB self care with mod I and LB self care with supervision.  Pt went to therapy gym to work on right shoulder AROM in gravity eliminated sidelying position. He is able to lift arm to 140 in sidelying.   Scapular strengthening with trunk extension. Forward reaching with RUE on table with retraction.    Visit 2: Pt seen in gym for dynamic standing balance activities in parallel bars with RLE AROM, side stepping, lateral weight shifts, mini squats with UE support. Ambulation with RW and foot supported in dorsiflexion with ace wrap with supervision. Right shoulder AROM in sitting using hula hoop.  Pt demonstrates excellent endurance and progress from yesterday.  Therapy Documentation Precautions:  Precautions Precautions: Fall Precaution Comments: R sided weakness Restrictions Weight Bearing Restrictions: No   Pain: Pain Assessment Pain Assessment: No/denies pain  ADL:  See FIM for current functional status  Therapy/Group: Individual Therapy  [name] 08/06/2012, 12:17 PM

## 2012-08-06 NOTE — Progress Notes (Signed)
Subjective: No new complaints except pruritus last night after taking rosuvastatin. Slight improvement in right-sided weakness.  Objective: Current vital signs: BP 121/78  Pulse 80  Temp 98.6 F (37 C) (Oral)  SpO2 94%  Neurologic Exam: Alert and in no acute distress. Mental status was normal. No right facial weakness. Speech was normal without dysarthria. No objective signs of right extremity weakness. There is marked embellishment and less than full effort voluntarily with manual testing of right extremities.  Lab Results: Results for orders placed during the hospital encounter of 08/04/12 (from the past 48 hour(s))  COMPREHENSIVE METABOLIC PANEL     Status: Abnormal   Collection Time   08/04/12  5:55 PM      Component Value Range Comment   Sodium 138  135 - 145 mEq/L    Potassium 3.8  3.5 - 5.1 mEq/L    Chloride 100  96 - 112 mEq/L    CO2 29  19 - 32 mEq/L    Glucose, Bld 93  70 - 99 mg/dL    BUN 12  6 - 23 mg/dL    Creatinine, Ser 4.09  0.50 - 1.35 mg/dL    Calcium 9.6  8.4 - 81.1 mg/dL    Total Protein 7.3  6.0 - 8.3 g/dL    Albumin 3.9  3.5 - 5.2 g/dL    AST 29  0 - 37 U/L    ALT 29  0 - 53 U/L    Alkaline Phosphatase 48  39 - 117 U/L    Total Bilirubin 0.4  0.3 - 1.2 mg/dL    GFR calc non Af Amer 71 (*) >90 mL/min    GFR calc Af Amer 82 (*) >90 mL/min   CBC     Status: Normal   Collection Time   08/04/12  5:55 PM      Component Value Range Comment   WBC 7.8  4.0 - 10.5 K/uL    RBC 5.38  4.22 - 5.81 MIL/uL    Hemoglobin 16.0  13.0 - 17.0 g/dL    HCT 91.4  78.2 - 95.6 %    MCV 86.4  78.0 - 100.0 fL    MCH 29.7  26.0 - 34.0 pg    MCHC 34.4  30.0 - 36.0 g/dL    RDW 21.3  08.6 - 57.8 %    Platelets 265  150 - 400 K/uL   MRSA PCR SCREENING     Status: Normal   Collection Time   08/04/12  9:56 PM      Component Value Range Comment   MRSA by PCR NEGATIVE  NEGATIVE   HEMOGLOBIN A1C     Status: Normal   Collection Time   08/05/12  1:54 PM      Component Value  Range Comment   Hemoglobin A1C 5.5  <5.7 %    Mean Plasma Glucose 111  <117 mg/dL   LIPID PANEL     Status: Abnormal   Collection Time   08/06/12  6:30 AM      Component Value Range Comment   Cholesterol 195  0 - 200 mg/dL    Triglycerides 469  <629 mg/dL    HDL 48  >52 mg/dL    Total CHOL/HDL Ratio 4.1      VLDL 25  0 - 40 mg/dL    LDL Cholesterol 841 (*) 0 - 99 mg/dL     Studies/Results: [name]  08/05/2012  *RADIOLOGY REPORT*  Clinical Data:  Stroke.  Slurred speech  MRI HEAD WITHOUT CONTRAST MRA HEAD WITHOUT CONTRAST  Technique:  Multiplanar, multiecho pulse sequences of the brain and surrounding structures were obtained without intravenous contrast. Angiographic images of the head were obtained using MRA technique without contrast.  Comparison:  MRI 08/02/2012  MRI HEAD  Findings:  Small area restricted diffusion left parietal periventricular white matter is unchanged.  New area of restricted diffusion distal to this in the left parietal lobe compatible with acute infarct which was not present previously.  No other areas of acute infarct.  Mild chronic microvascular ischemia in the white matter.  Ventricle size is normal.  Negative for intracranial hemorrhage or mass.  IMPRESSION: Compared with the prior MRI of 08/02/2012, there is a new area restricted diffusion in the left parietal lobe measuring under 1 cm compatible with acute infarct.  Left parietal white matter acute infarct again noted.  MRA HEAD  Findings:  Right vertebral artery is dominant.  Both vertebral arteries are patent to the basilar.  PICA is patent bilaterally. Fenestration of the proximal basilar is noted.  Basilar is widely patent.  Superior cerebellar and posterior cerebral arteries are patent bilaterally.  Internal carotid artery is widely patent bilaterally without stenosis.  Anterior and middle cerebral arteries are patent bilaterally.  Negative for cerebral aneurysm.  IMPRESSION:  Negative   Original  Report Authenticated By: Janeece Riggers, M.D.    [name]  08/05/2012  *RADIOLOGY REPORT*  Clinical Data:  Stroke.  Slurred speech  MRI HEAD WITHOUT CONTRAST MRA HEAD WITHOUT CONTRAST  Technique:  Multiplanar, multiecho pulse sequences of the brain and surrounding structures were obtained without intravenous contrast. Angiographic images of the head were obtained using MRA technique without contrast.  Comparison:  MRI 08/02/2012  MRI HEAD  Findings:  Small area restricted diffusion left parietal periventricular white matter is unchanged.  New area of restricted diffusion distal to this in the left parietal lobe compatible with acute infarct which was not present previously.  No other areas of acute infarct.  Mild chronic microvascular ischemia in the white matter.  Ventricle size is normal.  Negative for intracranial hemorrhage or mass.  IMPRESSION: Compared with the prior MRI of 08/02/2012, there is a new area restricted diffusion in the left parietal lobe measuring under 1 cm compatible with acute infarct.  Left parietal white matter acute infarct again noted.  MRA HEAD  Findings:  Right vertebral artery is dominant.  Both vertebral arteries are patent to the basilar.  PICA is patent bilaterally. Fenestration of the proximal basilar is noted.  Basilar is widely patent.  Superior cerebellar and posterior cerebral arteries are patent bilaterally.  Internal carotid artery is widely patent bilaterally without stenosis.  Anterior and middle cerebral arteries are patent bilaterally.  Negative for cerebral aneurysm.  IMPRESSION:  Negative   Original Report Authenticated By: Janeece Riggers, M.D.     Medications:  Scheduled:   . aspirin EC  325 mg Oral Daily  . enoxaparin (LOVENOX) injection  40 mg Subcutaneous Q24H  . FLUoxetine  20 mg Oral Daily  . lisinopril  10 mg Oral Daily  . rosuvastatin  10 mg Oral q1800   ZOX:[id number], alum & mag hydroxide-simeth, bisacodyl, diphenhydrAMINE,  guaiFENesin-dextromethorphan, polyethylene glycol, prochlorperazine, prochlorperazine, prochlorperazine, traZODone  Assessment/Plan: Acute small punctate left cerebral ischemic infarctions as described above MRI report. No significant abnormality was seen on MRA. Hemoglobin A1c was normal. Fasting lipid panel showed elevated LDL.  Recommendations: 1. Continue inpatient rehabilitation as planned.  2. Continue aspirin daily 325 mg for antiplatelet therapy. 3. We'll continue to need a statin medication although he has experienced reactions to several with itching, including rosuvastatin. 4. 2-D echocardiogram is pending.  No further neurological intervention is indicated echocardiogram was unremarkable.  C.R. Roseanne Reno, MD [facility] [phone]  08/06/2012  9:35 AM

## 2012-08-06 NOTE — Progress Notes (Signed)
Physical Therapy Session Note  Patient Details  Name: Ricardo Murphy MRN: [id number] Date of Birth: 1955/09/07  Today's Date: 08/06/2012 Time: 1000-1100 Time Calculation (min): 60 min  Short Term Goals: Week 1:  PT Short Term Goal 1 (Week 1): Pt will perform bed<>chair transfers at various heights with Min A consistently PT Short Term Goal 2 (Week 1): Pt will demonstrate dynamic standing balance with Min A during functional activities PT Short Term Goal 3 (Week 1): Pt will ambulate 63' with RW in controlled environment with Mod A PT Short Term Goal 4 (Week 1): Pt will ascend/descent 5 stairs with 2 rails and Min A  Skilled Therapeutic Interventions/Progress Updates:  Treatment session focused on right LE muscle facilitation of the hip and knee. Patient reports spasms in right thigh with active movement. Therapist performed passive ROM and stretching to right hip and knee and would get occasional spasm of hip flexors and quads. Patient performed active assistive hip and knee flexion with ankle dorsiflexion in sidelying with gravity eliminated and dud not produce spasms. Patient supine to and from sit with minimal assist for right LE only. Patient ambulated with rolling walker 30 feet x 2 with minimal assistance for balance but moderate assist to help with right foot progression. Although patient has active dorsiflexion in sitting, it does not kick in during swing. Patient does not have the hip and knee strength to compensate for the decreased dorsiflexion and therefore needs occasional assist to progress right LE. Patient exercised for 8 minutes on Nustep at work load 4 without complaints of spasms. Patient ambulated with rolling walker 120 feet with minimal assistance and ace wrap to assist with dorsiflexion on right.  Therapy Documentation Precautions:  Precautions Precautions: Fall Precaution Comments: R sided weakness Restrictions Weight Bearing Restrictions: No  Pain: Pain  Assessment Pain Assessment: No/denies pain Pain Location: Leg Pain Orientation: Right Pain Descriptors: Spasm Pain Onset: Other (Comment) (with movement)  Locomotion : Ambulation Ambulation/Gait Assistance: 3: Mod assist   See FIM for current functional status  Therapy/Group: Individual Therapy  Arelia Longest M 08/06/2012, 12:04 PM

## 2012-08-06 NOTE — Progress Notes (Signed)
  Echocardiogram 2D Echocardiogram has been performed.  Ricardo Murphy FRANCES 08/06/2012, 12:46 PM

## 2012-08-06 NOTE — Progress Notes (Signed)
Speech Language Pathology Daily Session Note  Patient Details  Name: Ricardo Murphy MRN: [id number] Date of Birth: Oct 29, 1955  Today's Date: 08/06/2012 Time: 1191-4782 Time Calculation (min): 45 min  Short Term Goals: Week 1: SLP Short Term Goal 1 (Week 1): Pt. will implement word finding strategies in conversation with family and staff to convey thoughts with min verbal cues.Marland Kitchen SLP Short Term Goal 2 (Week 1): Pt. will demonstrate increased cognitive reorganization/executive functioning through functional tasks (i.e. making lists/schedules) with min verbal cues.   Skilled Therapeutic Interventions: Skilled treatment session focused on addressing high level word finding goal.  SLP facilitated session with supervision level semantic cues during a categorical generation task.  SLP also educated both patient and wife on word finding strategies and task that would target goals over weekend, they verbalized understanding.     FIM:  Comprehension Comprehension Mode: Auditory Comprehension: 7-Follows complex conversation/direction: With no assist Expression Expression Mode: Verbal Expression: 6-Expresses complex ideas: With extra time/assistive device Social Interaction Social Interaction: 7-Interacts appropriately with others - No medications needed. Problem Solving Problem Solving: 5-Solves complex 90% of the time/cues < 10% of the time Memory Memory: 6-More than reasonable amt of time FIM - Eating Eating Activity: 7: Complete independence:no helper  Pain Pain Assessment Pain Assessment: No/denies pain  Therapy/Group: Individual Therapy  Ricardo Murphy., CCC-SLP [phone]  Ricardo Murphy 08/06/2012, 3:51 PM

## 2012-08-06 NOTE — Progress Notes (Signed)
Patient in room shaking and stuttering "I don't know what's wrong" Wife in room trying to calm and reassure patient "panic attack". Patient awake and appropriate but anxious "this is how my stroke started". Advised to deep breather and relax. Patient back to normal in about a minute. Would like to have something to relax and sleep. Xanax added to help with anxiety issues. Will monitor for now.

## 2012-08-07 ENCOUNTER — Inpatient Hospital Stay (HOSPITAL_COMMUNITY): Payer: 59 | Admitting: Physical Therapy

## 2012-08-07 ENCOUNTER — Inpatient Hospital Stay (HOSPITAL_COMMUNITY): Payer: 59 | Admitting: *Deleted

## 2012-08-07 ENCOUNTER — Encounter (HOSPITAL_COMMUNITY): Payer: 59 | Admitting: *Deleted

## 2012-08-07 NOTE — Progress Notes (Addendum)
Patient ID: Ricardo Murphy, male   DOB: 02-01-1956, 56 y.o.   MRN: [id number] Subjective/Complaints: 56 year old male with history of HTN who was admitted to [facility] on 08/02/12 with right sided weakness, difficulty speaking and numbness right side. MRI brain done revealing acute infarct left frontoparietal region. Carotid dopplers without stenosis. He was started on ASA for CVA prophylaxis. He continues to have right hemiparesis with sensory deficits and speech has improved. Patient was advanced to D3 thin liquids. He was noted to have elevated BP and ace added for control  ECHO done  Wife with pt.  Discussed ECHO result. No new symptoms Had another anxiety episode last noc which responded to deep breathing Review of Systems  Constitutional: Negative for fever.  Eyes: Negative for blurred vision.  Respiratory: Negative for cough.   Cardiovascular: Negative for leg swelling.  Musculoskeletal: Positive for joint pain.  Neurological: Positive for tingling, sensory change and focal weakness. Negative for speech change.  Psychiatric/Behavioral: Negative for depression.   Objective: Vital Signs: Blood pressure 103/65, pulse 56, temperature 97.9 F (36.6 C), temperature source Oral, resp. rate 20, SpO2 98.00%. [name]  08/05/2012  *RADIOLOGY REPORT*  Clinical Data:  Stroke.  Slurred speech  MRI HEAD WITHOUT CONTRAST MRA HEAD WITHOUT CONTRAST  Technique:  Multiplanar, multiecho pulse sequences of the brain and surrounding structures were obtained without intravenous contrast. Angiographic images of the head were obtained using MRA technique without contrast.  Comparison:  MRI 08/02/2012  MRI HEAD  Findings:  Small area restricted diffusion left parietal periventricular white matter is unchanged.  New area of restricted diffusion distal to this in the left parietal lobe compatible with acute infarct which was not present previously.  No other areas of acute infarct.  Mild chronic  microvascular ischemia in the white matter.  Ventricle size is normal.  Negative for intracranial hemorrhage or mass.  IMPRESSION: Compared with the prior MRI of 08/02/2012, there is a new area restricted diffusion in the left parietal lobe measuring under 1 cm compatible with acute infarct.  Left parietal white matter acute infarct again noted.  MRA HEAD  Findings:  Right vertebral artery is dominant.  Both vertebral arteries are patent to the basilar.  PICA is patent bilaterally. Fenestration of the proximal basilar is noted.  Basilar is widely patent.  Superior cerebellar and posterior cerebral arteries are patent bilaterally.  Internal carotid artery is widely patent bilaterally without stenosis.  Anterior and middle cerebral arteries are patent bilaterally.  Negative for cerebral aneurysm.  IMPRESSION:  Negative   Original Report Authenticated By: Janeece Riggers, M.D.    [name]  08/05/2012  *RADIOLOGY REPORT*  Clinical Data:  Stroke.  Slurred speech  MRI HEAD WITHOUT CONTRAST MRA HEAD WITHOUT CONTRAST  Technique:  Multiplanar, multiecho pulse sequences of the brain and surrounding structures were obtained without intravenous contrast. Angiographic images of the head were obtained using MRA technique without contrast.  Comparison:  MRI 08/02/2012  MRI HEAD  Findings:  Small area restricted diffusion left parietal periventricular white matter is unchanged.  New area of restricted diffusion distal to this in the left parietal lobe compatible with acute infarct which was not present previously.  No other areas of acute infarct.  Mild chronic microvascular ischemia in the white matter.  Ventricle size is normal.  Negative for intracranial hemorrhage or mass.  IMPRESSION: Compared with the prior MRI of 08/02/2012, there is a new area restricted diffusion in the left parietal lobe  measuring under 1 cm compatible with acute infarct.  Left parietal white matter acute infarct again noted.  MRA HEAD   Findings:  Right vertebral artery is dominant.  Both vertebral arteries are patent to the basilar.  PICA is patent bilaterally. Fenestration of the proximal basilar is noted.  Basilar is widely patent.  Superior cerebellar and posterior cerebral arteries are patent bilaterally.  Internal carotid artery is widely patent bilaterally without stenosis.  Anterior and middle cerebral arteries are patent bilaterally.  Negative for cerebral aneurysm.  IMPRESSION:  Negative   Original Report Authenticated By: Janeece Riggers, M.D.    Results for orders placed during the hospital encounter of 08/04/12 (from the past 72 hour(s))  COMPREHENSIVE METABOLIC PANEL     Status: Abnormal   Collection Time   08/04/12  5:55 PM      Component Value Range Comment   Sodium 138  135 - 145 mEq/L    Potassium 3.8  3.5 - 5.1 mEq/L    Chloride 100  96 - 112 mEq/L    CO2 29  19 - 32 mEq/L    Glucose, Bld 93  70 - 99 mg/dL    BUN 12  6 - 23 mg/dL    Creatinine, Ser 4.54  0.50 - 1.35 mg/dL    Calcium 9.6  8.4 - 09.8 mg/dL    Total Protein 7.3  6.0 - 8.3 g/dL    Albumin 3.9  3.5 - 5.2 g/dL    AST 29  0 - 37 U/L    ALT 29  0 - 53 U/L    Alkaline Phosphatase 48  39 - 117 U/L    Total Bilirubin 0.4  0.3 - 1.2 mg/dL    GFR calc non Af Amer 71 (*) >90 mL/min    GFR calc Af Amer 82 (*) >90 mL/min   CBC     Status: Normal   Collection Time   08/04/12  5:55 PM      Component Value Range Comment   WBC 7.8  4.0 - 10.5 K/uL    RBC 5.38  4.22 - 5.81 MIL/uL    Hemoglobin 16.0  13.0 - 17.0 g/dL    HCT 11.9  14.7 - 82.9 %    MCV 86.4  78.0 - 100.0 fL    MCH 29.7  26.0 - 34.0 pg    MCHC 34.4  30.0 - 36.0 g/dL    RDW 56.2  13.0 - 86.5 %    Platelets 265  150 - 400 K/uL   MRSA PCR SCREENING     Status: Normal   Collection Time   08/04/12  9:56 PM      Component Value Range Comment   MRSA by PCR NEGATIVE  NEGATIVE   HEMOGLOBIN A1C     Status: Normal   Collection Time   08/05/12  1:54 PM      Component Value Range Comment    Hemoglobin A1C 5.5  <5.7 %    Mean Plasma Glucose 111  <117 mg/dL   LIPID PANEL     Status: Abnormal   Collection Time   08/06/12  6:30 AM      Component Value Range Comment   Cholesterol 195  0 - 200 mg/dL    Triglycerides 784  <696 mg/dL    HDL 48  >29 mg/dL    Total CHOL/HDL Ratio 4.1      VLDL 25  0 - 40 mg/dL    LDL Cholesterol 528 (*) 0 -  99 mg/dL       Nursing note and vitals reviewed.  Constitutional: He is oriented to person, place, and time. He appears well-developed and well-nourished. Large frame  HENT: oral mucosa pink and moist.  Head: Normocephalic and atraumatic.  Neck: Normal range of motion. Neck supple.  Cardiovascular: Normal rate and regular rhythm. No murmur.  Pulmonary/Chest: Effort normal and breath sounds normal. No wheezes, rales, or rhonchi.  Abdominal: Soft. Bowel sounds are normal.  Neurological: He is alert and oriented to person, place, and time.  Speech clear. Mild right central 7. Follows Commands without difficulty.  reasonable insight and awareness. Occasional word finding deficits. Right UE is 2/5 deltoid, 3-4 at bicep, tricep, 3- hand intrinsics. RLE is 2-/5 HF, 2-/5 KE, 3/5 at ADF, APF. Minimal sensory deficits. Good sitting balance   Assessment/Plan: 1. Functional deficits secondary to Left frontoparietal infarct which require 3+ hours per day of interdisciplinary therapy in a comprehensive inpatient rehab setting. Physiatrist is providing close team supervision and 24 hour management of active medical problems listed below. Physiatrist and rehab team continue to assess barriers to discharge/monitor patient progress toward functional and medical goals. FIM: FIM - Bathing Bathing Steps Patient Completed: Chest;Right Arm;Left Arm;Abdomen;Front perineal area;Buttocks;Right lower leg (including foot);Left upper leg;Right upper leg;Left lower leg (including foot) Bathing: 5: Supervision: Safety issues/verbal cues  FIM - Upper Body  Dressing/Undressing Upper body dressing/undressing steps patient completed: Thread/unthread right sleeve of pullover shirt/dresss;Thread/unthread left sleeve of pullover shirt/dress;Pull shirt over trunk;Put head through opening of pull over shirt/dress Upper body dressing/undressing: 6: More than reasonable amount of time FIM - Lower Body Dressing/Undressing Lower body dressing/undressing steps patient completed: Thread/unthread right underwear leg;Thread/unthread left underwear leg;Pull underwear up/down;Thread/unthread right pants leg;Thread/unthread left pants leg;Pull pants up/down;Don/Doff right shoe;Don/Doff left sock Lower body dressing/undressing: 5: Supervision: Safety issues/verbal cues  FIM - Toileting Toileting steps completed by patient: Adjust clothing prior to toileting;Adjust clothing after toileting;Performs perineal hygiene Toileting: 4: Steadying assist  FIM - Diplomatic Services operational officer Devices: Grab bars Toilet Transfers: 4-To toilet/BSC: Min A (steadying Pt. > 75%);4-From toilet/BSC: Min A (steadying Pt. > 75%)  FIM - Bed/Chair Transfer Bed/Chair Transfer Assistive Devices: Therapist, occupational: 4: Bed > Chair or W/C: Min A (steadying Pt. > 75%)  FIM - Locomotion: Wheelchair Distance: 120 Locomotion: Wheelchair: 2: Travels 50 - 149 ft with minimal assistance (Pt.>75%) FIM - Locomotion: Ambulation Locomotion: Ambulation Assistive Devices: Designer, industrial/product Ambulation/Gait Assistance: 4: Min assist Locomotion: Ambulation: 2: Travels 50 - 149 ft with minimal assistance (Pt.>75%)  Comprehension Comprehension Mode: Auditory Comprehension: 7-Follows complex conversation/direction: With no assist  Expression Expression Mode: Verbal Expression: 6-Expresses complex ideas: With extra time/assistive device  Social Interaction Social Interaction: 7-Interacts appropriately with others - No medications needed.  Problem Solving Problem Solving:  5-Solves complex 90% of the time/cues < 10% of the time  Memory Memory: 6-More than reasonable amt of time  Medical Problem List and Plan:  1. DVT Prophylaxis/Anticoagulation: Pharmaceutical: Lovenox  2. Pain Management: N/A  3. Mood: Was started on Prozac for depressed mood. He seems to have a positive outlook at current time. Will monitor for now.  4. Neuropsych: This patient is capable of making decisions on his/her own behalf.  5. HTN: monitor with bid checks. Continue lisinopril  6. Dyslipidemia: crestor started today. Will monitor for tolerance/SE.  7. Stroke recurrent but without new clinical symptoms while on prophylaxis with ECASA 325mg  qday. Repeat MRI demonstrated a new 1 cm infarct in the R  parietal area compared to 12/9. ECHO no source of embolism   LOS (Days) 3 A FACE TO FACE EVALUATION WAS PERFORMED  Theodora Lalanne E 08/07/2012, 10:35 AM

## 2012-08-07 NOTE — Progress Notes (Signed)
Physical Therapy Session Note  Patient Details  Name: Ricardo Murphy MRN: [id number] Date of Birth: [birth year]  Today's Date: 08/07/2012 Time: 0800-0845 Time Calculation (min): 45 min  Short Term Goals: Week 1:  PT Short Term Goal 1 (Week 1): Pt will perform bed<>chair transfers at various heights with Min A consistently PT Short Term Goal 2 (Week 1): Pt will demonstrate dynamic standing balance with Min A during functional activities PT Short Term Goal 3 (Week 1): Pt will ambulate 39' with RW in controlled environment with Mod A PT Short Term Goal 4 (Week 1): Pt will ascend/descent 5 stairs with 2 rails and Min A  Skilled Therapeutic Interventions/Progress Updates:  Patient performed supine/sit transfer with supervision; patient transferred from sitting EOB to standing at RW with min A; standing balance activities at sink with RW and min A without LOB; patient ambulated with RW (following with WC) and min A for 43' with one standing rest break and occasional tactile cuing for increased right hip/knee flexion to improve swing through.  Patient worked on NuStep at level 4 for 8 minutes at 40 step per minute rate for increased endurance and LE strengthening.  Patient ascended/descended 10 steps x 2 bouts with min A and cuing for correct sequence with use of 2 handrails.  Returned to room via WC.     Therapy Documentation Precautions:  Precautions Precautions: Fall Precaution Comments: R sided weakness Restrictions Weight Bearing Restrictions: No   Pain: Pain Assessment Pain Assessment: No/denies pain  See FIM for current functional status  Therapy/Group: Individual Therapy  Rexene Agent 08/07/2012, 8:56 AM

## 2012-08-08 ENCOUNTER — Inpatient Hospital Stay (HOSPITAL_COMMUNITY): Payer: 59 | Admitting: Physical Therapy

## 2012-08-08 ENCOUNTER — Inpatient Hospital Stay (HOSPITAL_COMMUNITY): Payer: 59 | Admitting: *Deleted

## 2012-08-08 NOTE — Progress Notes (Signed)
Patient ID: Ricardo Murphy, male   DOB: 1955/09/28, 56 y.o.   MRN: [id number]  Subjective/Complaints: 56 year old male with history of HTN who was admitted to [facility] on 08/02/12 with right sided weakness, difficulty speaking and numbness right side. MRI brain done revealing acute infarct left frontoparietal region. Carotid dopplers without stenosis. He was started on ASA for CVA prophylaxis. He continues to have right hemiparesis with sensory deficits and speech has improved. Patient was advanced to D3 thin liquids. He was noted to have elevated BP and ace added for control  ECHO done  Pt without new problems No new symptoms Had another anxiety episode last noc which responded to deep breathing Review of Systems  Constitutional: Negative for fever.  Eyes: Negative for blurred vision.  Respiratory: Negative for cough.   Cardiovascular: Negative for leg swelling.  Musculoskeletal: Positive for joint pain.  Neurological: Positive for tingling, sensory change and focal weakness. Negative for speech change.  Psychiatric/Behavioral: Negative for depression.   Objective: Vital Signs: Blood pressure 105/67, pulse 50, temperature 98.2 F (36.8 C), temperature source Oral, resp. rate 17, SpO2 98.00%. No results found. Results for orders placed during the hospital encounter of 08/04/12 (from the past 72 hour(s))  HEMOGLOBIN A1C     Status: Normal   Collection Time   08/05/12  1:54 PM      Component Value Range Comment   Hemoglobin A1C 5.5  <5.7 %    Mean Plasma Glucose 111  <117 mg/dL   LIPID PANEL     Status: Abnormal   Collection Time   08/06/12  6:30 AM      Component Value Range Comment   Cholesterol 195  0 - 200 mg/dL    Triglycerides 409  <811 mg/dL    HDL 48  >91 mg/dL    Total CHOL/HDL Ratio 4.1      VLDL 25  0 - 40 mg/dL    LDL Cholesterol 478 (*) 0 - 99 mg/dL       Nursing note and vitals reviewed.  Constitutional: He is oriented to person, place, and time. He appears  well-developed and well-nourished. Large frame  HENT: oral mucosa pink and moist.  Head: Normocephalic and atraumatic.  Neck: Normal range of motion. Neck supple.  Cardiovascular: Normal rate and regular rhythm. No murmur.  Pulmonary/Chest: Effort normal and breath sounds normal. No wheezes, rales, or rhonchi.  Abdominal: Soft. Bowel sounds are normal.  Neurological: He is alert and oriented to person, place, and time.  Speech clear. Mild right central 7. Follows Commands without difficulty.  reasonable insight and awareness. Occasional word finding deficits. Right UE is 2/5 deltoid, 3-4 at bicep, tricep, 3- hand intrinsics. RLE is 2-/5 HF, 2-/5 KE, 3/5 at ADF, APF. Minimal sensory deficits. Good sitting balance   Assessment/Plan: 1. Functional deficits secondary to Left frontoparietal infarct which require 3+ hours per day of interdisciplinary therapy in a comprehensive inpatient rehab setting. Physiatrist is providing close team supervision and 24 hour management of active medical problems listed below. Physiatrist and rehab team continue to assess barriers to discharge/monitor patient progress toward functional and medical goals. FIM: FIM - Bathing Bathing Steps Patient Completed: Chest;Right Arm;Left Arm;Abdomen;Front perineal area;Buttocks;Right lower leg (including foot);Left upper leg;Right upper leg;Left lower leg (including foot) Bathing: 5: Supervision: Safety issues/verbal cues  FIM - Upper Body Dressing/Undressing Upper body dressing/undressing steps patient completed: Thread/unthread right sleeve of pullover shirt/dresss;Thread/unthread left sleeve of pullover shirt/dress;Pull shirt over trunk;Put head through opening of pull over  shirt/dress Upper body dressing/undressing: 6: More than reasonable amount of time FIM - Lower Body Dressing/Undressing Lower body dressing/undressing steps patient completed: Thread/unthread right underwear leg;Thread/unthread left underwear leg;Pull  underwear up/down;Thread/unthread right pants leg;Thread/unthread left pants leg;Pull pants up/down;Don/Doff right shoe;Don/Doff left sock Lower body dressing/undressing: 5: Supervision: Safety issues/verbal cues  FIM - Toileting Toileting steps completed by patient: Adjust clothing prior to toileting;Adjust clothing after toileting;Performs perineal hygiene Toileting Assistive Devices: Grab bar or rail for support Toileting: 4: Steadying assist  FIM - Diplomatic Services operational officer Devices: Grab bars Toilet Transfers: 4-To toilet/BSC: Min A (steadying Pt. > 75%);4-From toilet/BSC: Min A (steadying Pt. > 75%)  FIM - Bed/Chair Transfer Bed/Chair Transfer Assistive Devices: Therapist, occupational: 4: Supine > Sit: Min A (steadying Pt. > 75%/lift 1 leg)  FIM - Locomotion: Wheelchair Distance: 120 Locomotion: Wheelchair: 2: Travels 50 - 149 ft with minimal assistance (Pt.>75%) FIM - Locomotion: Ambulation Locomotion: Ambulation Assistive Devices: Designer, industrial/product Ambulation/Gait Assistance: 4: Min assist Locomotion: Ambulation: 2: Travels 50 - 149 ft with minimal assistance (Pt.>75%)  Comprehension Comprehension Mode: Auditory Comprehension: 7-Follows complex conversation/direction: With no assist  Expression Expression Mode: Verbal Expression: 6-Expresses complex ideas: With extra time/assistive device  Social Interaction Social Interaction: 6-Interacts appropriately with others with medication or extra time (anti-anxiety, antidepressant).  Problem Solving Problem Solving: 5-Solves complex 90% of the time/cues < 10% of the time  Memory Memory: 6-More than reasonable amt of time  Medical Problem List and Plan:  1. DVT Prophylaxis/Anticoagulation: Pharmaceutical: Lovenox  2. Pain Management: N/A  3. Mood: Was started on Prozac for depressed mood. He seems to have a positive outlook at current time. Will monitor for now.  4. Neuropsych: This patient is capable  of making decisions on his/her own behalf.  5. HTN: monitor with bid checks. Continue lisinopril  6. Dyslipidemia: crestor started today. Will monitor for tolerance/SE.  7. Stroke recurrent but without new clinical symptoms while on prophylaxis with ECASA 325mg  qday. Repeat MRI demonstrated a new 1 cm infarct in the R parietal area compared to 12/9. ECHO no source of embolism   LOS (Days) 4 A FACE TO FACE EVALUATION WAS PERFORMED  KIRSTEINS,ANDREW E 08/08/2012, 10:51 AM

## 2012-08-08 NOTE — Progress Notes (Signed)
Occupational Therapy Note  Patient Details  Name: Ricardo Murphy MRN: [id number] Date of Birth: 12-03-1955 Today's Date: 08/08/2012  Time: [phone])  (60 min)  1st session Individual therapy Pain:  None 1st session:  Pt. Already bathed and dressed prior to OT arrival.  Pt. Propelled wc to gym and transferred from wc to mat.  Peformed neuromuscular facilitation using PNF patterns, active ROM,, and resistance.  Ppt has good AROM in all joints.  Performed all exercises with no pain.  Pt had difficulty with balancing tray on hand and going into horizontal adduction.     Time: ([id number])  (30 min)  2nd session Individual therapy Pain:  None 2nd session:    Utilized RUE in sitting with activities for reaching, grasping, manipulation, visual attention to task, and concentration.  Pt.showed good grasp and release as well as good shoulder movements.    Humberto Seals 08/08/2012, 8:10 AM

## 2012-08-08 NOTE — Progress Notes (Signed)
Good night sleep. PRN trazodone given at 2041. Patient's brother stayed the night. Ricardo Murphy

## 2012-08-08 NOTE — Progress Notes (Signed)
Physical Therapy Note  Patient Details  Name: Ricardo Murphy MRN: [id number] Date of Birth: 04-Feb-1956 Today's Date: 08/08/2012  1100-1200 (60 minutes) individual   Pain: no complaint of pain Focus of treatment: Neuro re-ed RT LE Treatment: Transfers- wc to toilet with RW SBA; Nustep Level 4 X 10 minutes for RT LE strengthening/activity tolerance; Standing RT hip flexion to 4 inch step, 8 inch step X 10; up/down 4 inch step RT LE for concentric/eccentric quad control; standing RT hip abduction X 10, RT hip extension X 10, RT hamstring curls X 10; gait to room 120 feet RW SBA with vcs for heel toe gait on right.  1300-1330 (30 minutes) individual Pain: no complaint of pain Focus of treatment: Therapeutic exercise focused on RT Knee extension control in weightbearing/ standing balance Treatment: Kinetron in sitting X 30 reps; in standing with UE support 2 X 20 reps, in standing without UE support X 10 reps for balance  [name],JIM 08/08/2012, 7:26 AM

## 2012-08-09 ENCOUNTER — Inpatient Hospital Stay (HOSPITAL_COMMUNITY): Payer: 59 | Admitting: Occupational Therapy

## 2012-08-09 ENCOUNTER — Inpatient Hospital Stay (HOSPITAL_COMMUNITY): Payer: 59

## 2012-08-09 ENCOUNTER — Inpatient Hospital Stay (HOSPITAL_COMMUNITY): Payer: 59 | Admitting: Speech Pathology

## 2012-08-09 DIAGNOSIS — G8110 Spastic hemiplegia affecting unspecified side: Secondary | ICD-10-CM

## 2012-08-09 DIAGNOSIS — I6330 Cerebral infarction due to thrombosis of unspecified cerebral artery: Secondary | ICD-10-CM

## 2012-08-09 NOTE — Progress Notes (Signed)
Physical Therapy Session Note  Patient Details  Name: Ricardo Murphy MRN: [id number] Date of Birth: Mar 16, 1956  Today's Date: 08/09/2012 Time: 0805-0900 Time Calculation (min): 55 min  Short Term Goals: Week 1:  PT Short Term Goal 1 (Week 1): Pt will perform bed<>chair transfers at various heights with Min A consistently PT Short Term Goal 2 (Week 1): Pt will demonstrate dynamic standing balance with Min A during functional activities PT Short Term Goal 3 (Week 1): Pt will ambulate 65' with RW in controlled environment with Mod A PT Short Term Goal 4 (Week 1): Pt will ascend/descent 5 stairs with 2 rails and Min A  Skilled Therapeutic Interventions/Progress Updates:  Self ROM R and L hamstrings and heel cords in standing, supervision for balance.  neuromuscular re-education via VCs, tactile cues, forced use, demo, for: - Gait with RW x 160' x 2 with supervision, focusing on upright posture, forward gaze, and R ankle DF -isolated RLE hip/knee and ankle activation in supine -trunk shortening /lengthening with wt shifting and UE reaching, in unsupported sitting -NuSTep at level 4, focusing on trunk rotation -step/taps RLE onto 4" ht stool in standing -closed chain L and R hip abduction in standing, close supervision  Simulated car transfer to SUV height seat, with RW, supervision     Therapy Documentation Precautions:  Precautions Precautions: Fall Precaution Comments: R sided weakness Restrictions Weight Bearing Restrictions: No   Vital Signs: Therapy Vitals BP: 133/80 mmHg Pain: Pain Assessment Pain Assessment: No/denies pain   Locomotion : Ambulation Ambulation/Gait Assistance: 5: Supervision     See FIM for current functional status  Therapy/Group: Individual Therapy  Marcello Tuzzolino 08/09/2012, 11:04 AM

## 2012-08-09 NOTE — Progress Notes (Signed)
Social Work Patient ID: Ricardo Murphy, male   DOB: Apr 03, 1956, 56 y.o.   MRN: [id number] Wife asked worker to fax insurance-[employer] forms for STD.  Faxed and gave back to wife. Both pleased with how well pt is doing here and progressing.

## 2012-08-09 NOTE — Progress Notes (Signed)
Patient ID: RIGOBERTO REPASS, male   DOB: December 17, 1955, 56 y.o.   MRN: [id number]  Subjective/Complaints: 56 year old male with history of HTN who was admitted to [facility] on 08/02/12 with right sided weakness, difficulty speaking and numbness right side. MRI brain done revealing acute infarct left frontoparietal region. Carotid dopplers without stenosis. He was started on ASA for CVA prophylaxis. He continues to have right hemiparesis with sensory deficits and speech has improved. Patient was advanced to D3 thin liquids. He was noted to have elevated BP and ace added for control   Pt without new problems No new symptoms No more anxiety episodes Review of Systems  Constitutional: Negative for fever.  Eyes: Negative for blurred vision.  Respiratory: Negative for cough.   Cardiovascular: Negative for leg swelling.  Musculoskeletal: Positive for joint pain.  Neurological: Positive for tingling, sensory change and focal weakness. Negative for speech change.  Psychiatric/Behavioral: Negative for depression.   Objective: Vital Signs: Blood pressure 103/67, pulse 60, temperature 98.2 F (36.8 C), temperature source Oral, resp. rate 18, SpO2 94.00%. No results found. No results found for this or any previous visit (from the past 72 hour(s)).    Nursing note and vitals reviewed.  Constitutional: He is oriented to person, place, and time. He appears well-developed and well-nourished. Large frame  HENT: oral mucosa pink and moist.  Head: Normocephalic and atraumatic.  Neck: Normal range of motion. Neck supple.  Cardiovascular: Normal rate and regular rhythm. No murmur.  Pulmonary/Chest: Effort normal and breath sounds normal. No wheezes, rales, or rhonchi.  Abdominal: Soft. Bowel sounds are normal.  Neurological: He is alert and oriented to person, place, and time.  Speech clear. Mild right central 7. Follows Commands without difficulty.  reasonable insight and awareness. Occasional word finding  deficits. Right UE is 3/5 deltoid, 3-4 at bicep, tricep, 3-4 hand intrinsics. RLE is 3-/5 HF, 3-/5 KE, 3/5 at ADF, APF. Minimal sensory deficits. Good sitting balance   Assessment/Plan: 1. Functional deficits secondary to Left frontoparietal infarct which require 3+ hours per day of interdisciplinary therapy in a comprehensive inpatient rehab setting. Physiatrist is providing close team supervision and 24 hour management of active medical problems listed below. Physiatrist and rehab team continue to assess barriers to discharge/monitor patient progress toward functional and medical goals. FIM: FIM - Bathing Bathing Steps Patient Completed: Chest;Right Arm;Left Arm;Abdomen;Front perineal area;Buttocks;Right lower leg (including foot);Left upper leg;Right upper leg;Left lower leg (including foot) Bathing: 5: Supervision: Safety issues/verbal cues  FIM - Upper Body Dressing/Undressing Upper body dressing/undressing steps patient completed: Thread/unthread right sleeve of pullover shirt/dresss;Thread/unthread left sleeve of pullover shirt/dress;Pull shirt over trunk;Put head through opening of pull over shirt/dress Upper body dressing/undressing: 6: More than reasonable amount of time FIM - Lower Body Dressing/Undressing Lower body dressing/undressing steps patient completed: Thread/unthread right underwear leg;Thread/unthread left underwear leg;Pull underwear up/down;Thread/unthread right pants leg;Thread/unthread left pants leg;Pull pants up/down;Don/Doff right shoe;Don/Doff left sock Lower body dressing/undressing: 5: Supervision: Safety issues/verbal cues  FIM - Toileting Toileting steps completed by patient: Adjust clothing prior to toileting;Adjust clothing after toileting;Performs perineal hygiene Toileting Assistive Devices: Grab bar or rail for support Toileting: 4: Steadying assist  FIM - Diplomatic Services operational officer Devices: Grab bars Toilet Transfers: 4-From  toilet/BSC: Min A (steadying Pt. > 75%);4-To toilet/BSC: Min A (steadying Pt. > 75%)  FIM - Bed/Chair Transfer Bed/Chair Transfer Assistive Devices: Therapist, occupational: 4: Supine > Sit: Min A (steadying Pt. > 75%/lift 1 leg);3: Bed > Chair or W/C: Mod A (  lift or lower assist);3: Chair or W/C > Bed: Mod A (lift or lower assist)  FIM - Locomotion: Wheelchair Distance: 120 Locomotion: Wheelchair: 2: Travels 50 - 149 ft with minimal assistance (Pt.>75%) FIM - Locomotion: Ambulation Locomotion: Ambulation Assistive Devices: Designer, industrial/product Ambulation/Gait Assistance: 4: Min assist Locomotion: Ambulation: 2: Travels 50 - 149 ft with minimal assistance (Pt.>75%)  Comprehension Comprehension Mode: Auditory Comprehension: 7-Follows complex conversation/direction: With no assist  Expression Expression Mode: Verbal Expression: 5-Expresses complex 90% of the time/cues < 10% of the time  Social Interaction Social Interaction: 6-Interacts appropriately with others with medication or extra time (anti-anxiety, antidepressant).  Problem Solving Problem Solving: 5-Solves complex 90% of the time/cues < 10% of the time  Memory Memory: 6-More than reasonable amt of time  Medical Problem List and Plan:  1. DVT Prophylaxis/Anticoagulation: Pharmaceutical: Lovenox  2. Pain Management: N/A  3. Mood: Was started on Prozac for depressed mood. He seems to have a positive outlook at current time. Will monitor for now.  4. Neuropsych: This patient is capable of making decisions on his/her own behalf.  5. HTN: monitor with bid checks. Continue lisinopril  6. Dyslipidemia: crestor started today. Will monitor for tolerance/SE.  7. Stroke recurrent but without new clinical symptoms while on prophylaxis with ECASA 325mg  qday. Repeat MRI demonstrated a new 1 cm infarct in the R parietal area compared to 12/9. ECHO no source of embolism   LOS (Days) 5 A FACE TO FACE EVALUATION WAS  PERFORMED  KIRSTEINS,ANDREW E 08/09/2012, 8:23 AM

## 2012-08-09 NOTE — Progress Notes (Signed)
Occupational Therapy Session Note  Patient Details  Name: Ricardo Murphy MRN: [id number] Date of Birth: May 16, 1956  Today's Date: 08/09/2012 Time: 0900-1000 and 1100-1130 Time Calculation (min): 60 min and 30 min  Short Term Goals: Week 1:  OT Short Term Goal 1 (Week 1): Pt will bathe with supervision in shower. OT Short Term Goal 2 (Week 1): Pt will toilet with supervision. OT Short Term Goal 3 (Week 1): Pt will be able to actively lift RUE overhead.  Skilled Therapeutic Interventions/Progress Updates:    Visit 1: Pt seen for ADL retraining of bathing at shower level and dressing with a focus on dynamic standing balance.  Pt continues to want to don and doff pants over feet from standing, but his RLE is not quite stable enough. Needs reminders to sit to dress LB.  Otherwise, pt is moving very well in his room with RW with distant supervision and completing his ADLs well.  He now has 130 degrees of right shoulder AROM.  Pt worked on wall slides with RUE and pt was able to overhead reach to full ROM of 180 degrees.  Visit 2: Pt ambulated to gym with RW to work on right shoulder AROM and coordination and dynamic standing balance on RLE.  Pt used hula hoop in both hands to lift hoop over head and pull down over head to facilitate strength and ROM in shoulder.  He also stood with walker and lifted LLE off floor to work on RLE stabilization with mini knee bends.  Pt states that he feels that his sensation in his RLE has fully returned.  Therapy Documentation Precautions:  Precautions Precautions: Fall Precaution Comments: R sided weakness Restrictions Weight Bearing Restrictions: No  Pain: Pain Assessment Pain Assessment: No/denies pain ADL:  See FIM for current functional status  Therapy/Group: Individual Therapy  Tetsuo Coppola 08/09/2012, 11:51 AM

## 2012-08-09 NOTE — Progress Notes (Signed)
Slept without incident last night. PRN trazodone 50mg  given at HS to assist with sleep and med was effective.  Word finding deficits noted. Ricardo Murphy A

## 2012-08-09 NOTE — Progress Notes (Signed)
Speech Language Pathology Daily Session Note  Patient Details  Name: Ricardo Murphy MRN: [id number] Date of Birth: 17-Dec-1955  Today's Date: 08/09/2012 Time: 1420-1505 Time Calculation (min): 45 min  Short Term Goals: Week 1: SLP Short Term Goal 1 (Week 1): Pt. will implement word finding strategies in conversation with family and staff to convey thoughts with min verbal cues.Marland Kitchen SLP Short Term Goal 2 (Week 1): Pt. will demonstrate increased cognitive reorganization/executive functioning through functional tasks (i.e. making lists/schedules) with min verbal cues.   Skilled Therapeutic Interventions: Skilled treatment session focused on addressing high level word finding and complex problem solving goals.  SLP facilitated session with supervision level semantic cues during a recall and categorical task.  SLP also educated both patient and wife on word finding and recall association strategies.  Patient exhibited some initial phoneme reps during verbal expression; however, given inconsistent nature and onset appearing to be duirng moments of anomia it is suspected that this will resolve with time.    FIM:  Comprehension Comprehension Mode: Auditory Comprehension: 7-Follows complex conversation/direction: With no assist Expression Expression Mode: Verbal Expression: 6-Expresses complex ideas: With extra time/assistive device Social Interaction Social Interaction: 6-Interacts appropriately with others with medication or extra time (anti-anxiety, antidepressant). Problem Solving Problem Solving: 5-Solves complex 90% of the time/cues < 10% of the time Memory Memory: 6-More than reasonable amt of time FIM - Eating Eating Activity: 7: Complete independence:no helper  Pain Pain Assessment Pain Assessment: Yes Pain Score:   7 Pain Type: Acute pain Pain Location: Head Pain Orientation: Left Pain Descriptors: Aching Pain Frequency: Occasional Pain Onset: Gradual Patients Stated Pain Goal:  2 Pain Intervention(s): RN notified  Therapy/Group: Individual Therapy  Charlane Ferretti., CCC-SLP [phone]  [name] 08/09/2012, 3:22 PM

## 2012-08-10 ENCOUNTER — Inpatient Hospital Stay (HOSPITAL_COMMUNITY): Payer: 59 | Admitting: Occupational Therapy

## 2012-08-10 ENCOUNTER — Inpatient Hospital Stay (HOSPITAL_COMMUNITY): Payer: 59 | Admitting: Speech Pathology

## 2012-08-10 ENCOUNTER — Inpatient Hospital Stay (HOSPITAL_COMMUNITY): Payer: 59

## 2012-08-10 NOTE — Progress Notes (Signed)
Occupational Therapy Session Note  Patient Details  Name: Ricardo Murphy MRN: [id number] Date of Birth: December 17, 1955  Today's Date: 08/10/2012 Time: 0805-0900 and  1000-1030 Time Calculation (min): 55 min and 30 min  Short Term Goals: Week 1:  OT Short Term Goal 1 (Week 1): Pt will bathe with supervision in shower. OT Short Term Goal 2 (Week 1): Pt will toilet with supervision. OT Short Term Goal 3 (Week 1): Pt will be able to actively lift RUE overhead.  Skilled Therapeutic Interventions/Progress Updates:    Visit 1: No c/o pain.  Pt seen for BADL retraining at shower level with a focus on dynamic standing balance.  Pt was able to ambulate in his room without AD and without assistance.  No LOB or safety issues.  Pt is fully independent in his room.  Pt will not need any AE for home. Pt ambulated to the kitchen to prepare pudding for the patient party this afternoon. He was able to prepare and wash the dishes without assist.  Visit 2:  Pt returned to the kitchen to make sausage balls following a simple recipe.  He stood for over 30 min preparing the food and rolling the balls without fatigue and with no LOB. Pt now has 160 degrees of arom in shoulder flexion and uses his arm as a dominant assist.  Pt has met all of his goals and is at an independent level. Pt is ready for discharge.   Therapy Documentation  Pain: Pain Assessment Pain Assessment: No/denies pain ADL:  See FIM for current functional status  Therapy/Group: Individual Therapy  SAGUIER,JULIA 08/10/2012, 10:50 AM

## 2012-08-10 NOTE — Progress Notes (Signed)
Social Work Patient ID: Ricardo Murphy, male   DOB: 04-18-1956, 56 y.o.   MRN: [id number] Met with wife when here to discus discharge tomorrow.  She is pleased with pt's progress.  MD agreeable to discharge tomorrow, no medical concerns. Still debating whether he needs OPPT.  Kriste Basque W to decide and make her recommendations.

## 2012-08-10 NOTE — Progress Notes (Signed)
Social Work Patient ID: Ricardo Murphy, male   DOB: June 24, 1956, 56 y.o.   MRN: [id number] Team feels pt has reached goals and is ready for discharge tomorrow.  MD contacted and awaiting his response. Discussed follow up with team.  Get ready for discharge tomorrow.  Pam-PA aware of and will gather paperwork.

## 2012-08-10 NOTE — Progress Notes (Signed)
Speech Language Pathology Daily Session Note & Discharge Summary  Patient Details  Name: TC KAPUSTA MRN: [id number] Date of Birth: [birth year]  Today's Date: 08/10/2012 Time: 1050-1130 Time Calculation (min): 40 min  Short Term Goals: Week 1: SLP Short Term Goal 1 (Week 1): Pt. will implement word finding strategies in conversation with family and staff to convey thoughts with min verbal cues.Marland Kitchen SLP Short Term Goal 2 (Week 1): Pt. will demonstrate increased cognitive reorganization/executive functioning through functional tasks (i.e. making lists/schedules) with min verbal cues.   Skilled Therapeutic Interventions: Skilled treatment session focused on addressing high level word finding and complex problem solving goals. SLP facilitated session with discussion regarding discharge patient verbalized [facility] anticipatory awareness with intermittent initial phoneme repetitions.  When SLP gave direct feedback to use slow/easy onset of speech it appeared to make dysfluencies worse and as conversation progressed and cues stopped fluency improved.  As a result, it is recommended that patient not receive follow up SLP services at this time; however, if difficulties persist after discharge it is recommended to seek follow up through MD.  SLP also educated both patient and wife on word finding and recall association strategies and both are in agreement with discharge recommendations.    FIM:  Comprehension Comprehension Mode: Auditory Comprehension: 7-Follows complex conversation/direction: With no assist Expression Expression Mode: Verbal Expression: 6-Expresses complex ideas: With extra time/assistive device Social Interaction Social Interaction: 6-Interacts appropriately with others with medication or extra time (anti-anxiety, antidepressant). Problem Solving Problem Solving: 7-Solves complex problems: Recognizes & self-corrects Memory Memory: 6-Assistive device: No helper FIM - Eating Eating  Activity: 7: Complete independence:no helper  Pain Pain Assessment Pain Assessment: No/denies pain  Therapy/Group: Individual Therapy   Speech Language Pathology Discharge Summary  Patient Details  Name: CANDLER GINSBERG MRN: [id number] Date of Birth: 1955-11-07  Today's Date: 08/10/2012  Patient has met 2 of 2 long term goals.  Patient to discharge at overall Modified Independent level.  Reasons goals not met: n/a   Clinical Impression/Discharge Summary: Patient met 2 out of 2 long term goals during CIR stay due to functional gains in self-monitoring and correcting with complex tasks, as well as with wording finding during conversational verbal expression.  Patient is discharging at an overall Mod I level with increased wait time to utilize word finding strategies.  Of note, patient intermittently exhibits initial phoneme repetitions, which appear to be a result of anomia and occurring while patient is finding the word he wants to use.  At this time no follow up SLP services are recommended.  SLP suspects that difficulty will resolve in a month or so; however, if they persist it is recommended that he follow up with MD to request outpatient SLP services.    Care Partner:  Caregiver Able to Provide Assistance: Yes    Recommendation:  None     Equipment: none   Reasons for discharge: Treatment goals met;Discharged from hospital   Patient/Family Agrees with Progress Made and Goals Achieved: Yes   See FIM for current functional status  Charlane Ferretti., CCC-SLP [phone]  Sulaiman Imbert 08/10/2012, 1:48 PM

## 2012-08-10 NOTE — Progress Notes (Signed)
Social Work Patient ID: Ricardo Murphy, male   DOB: 11-Sep-1955, 56 y.o.   MRN: [id number] PT-Becky doesn't recommend follow up.  Pt and wife are agreeable to this and if have issues at home will contact this worker and worker Will set up follow up PT.  They pref [facility] at [facility] aware wait until mid-Jan to see.  See how he does at home and wife will contact worker If issues.  All agreeable to plan.

## 2012-08-10 NOTE — Progress Notes (Signed)
Physical Therapy Discharge Summary  Patient Details  Name: Ricardo Murphy MRN: [id number] Date of Birth: 13-Sep-1955  Today's Date: 08/10/2012 Time: 0900-1000 Time Calculation (min): 60 min  Patient has met 7 of 7 long term goals due to improved activity tolerance, improved balance, increased strength and functional use of  right upper extremity and right lower extremity.  Patient to discharge at an ambulatory level Independent.   Patient and care partner are independentand have no further questions regarding discharge.  Reasons goals not met: N/A patient met all goals  Recommendation:  Patient does not need any skilled PT services at this time.   Equipment: No equipment provided  Reasons for discharge: treatment goals met and discharge from hospital  Patient/family agrees with progress made and goals achieved: Yes  PT Discharge Precautions/Restrictions Precautions Precautions: None Restrictions Weight Bearing Restrictions: No  Pain Pain Assessment Pain Assessment: No/denies pain Pain Score: 0-No pain Pain Type: Acute pain Pain Location: Head Pain Descriptors: Headache Pain Intervention(s): Medication (See eMAR)  Cognition Overall Cognitive Status: Appears within functional limits for tasks assessed  Sensation Sensation Light Touch: Appears Intact Stereognosis: Appears Intact Hot/Cold: Not tested Proprioception: Appears Intact Coordination Gross Motor Movements are Fluid and Coordinated: Yes Fine Motor Movements are Fluid and Coordinated: Yes  Motor  Motor Motor: Hemiplegia Motor - Discharge Observations: slight weakness in RLE hip flexors   Mobility Transfers Sit to Stand: 7: Independent Stand to Sit: 7: Independent Stand Pivot Transfers: 7: Independent  Locomotion  Ambulation Ambulation: Yes Ambulation/Gait Assistance: 7: Independent Ambulation Distance (Feet): 300 Feet Assistive device: None Gait Gait: Yes Gait Pattern: Within Functional  Limits High Level Ambulation High Level Ambulation: Side stepping;Backwards walking;Direction changes;Sudden stops;Head turns Stairs / Additional Locomotion Stairs: Yes Stairs Assistance: 6: Modified independent (Device/Increase time) Stair Management Technique: One rail Right Number of Stairs: 22  Height of Stairs: 6  Wheelchair Mobility Wheelchair Mobility: No   Trunk/Postural Assessment  Cervical Assessment Cervical Assessment: Within Functional Limits Thoracic Assessment Thoracic Assessment: Within Functional Limits Lumbar Assessment Lumbar Assessment: Within Functional Limits Postural Control Postural Control: Within Functional Limits   Balance Balance Balance Assessed: Yes Standardized Balance Assessment Standardized Balance Assessment: Berg Balance Test;Dynamic Gait Index Berg Balance Test Sit to Stand: Able to stand without using hands and stabilize independently Standing Unsupported: Able to stand safely 2 minutes Sitting with Back Unsupported but Feet Supported on Floor or Stool: Able to sit safely and securely 2 minutes Stand to Sit: Sits safely with minimal use of hands Transfers: Able to transfer safely, minor use of hands Standing Unsupported with Eyes Closed: Able to stand 10 seconds safely Standing Ubsupported with Feet Together: Able to place feet together independently and stand 1 minute safely From Standing, Reach Forward with Outstretched Arm: Can reach confidently >25 cm (10") From Standing Position, Pick up Object from Floor: Able to pick up shoe safely and easily From Standing Position, Turn to Look Behind Over each Shoulder: Looks behind from both sides and weight shifts well Turn 360 Degrees: Able to turn 360 degrees safely in 4 seconds or less Standing Unsupported, Alternately Place Feet on Step/Stool: Able to stand independently and safely and complete 8 steps in 20 seconds Standing Unsupported, One Foot in Front: Able to place foot tandem  independently and hold 30 seconds Standing on One Leg: Able to lift leg independently and hold > 10 seconds Total Score: 56  Dynamic Gait Index Level Surface: Normal Change in Gait Speed: Mild Impairment Gait with Horizontal Head Turns: Normal  Gait with Vertical Head Turns: Normal Gait and Pivot Turn: Normal Step Over Obstacle: Mild Impairment Step Around Obstacles: Normal Steps: Mild Impairment Total Score: 21  Dynamic Sitting Balance Dynamic Sitting - Level of Assistance: 7: Independent Static Standing Balance Static Standing - Level of Assistance: 7: Independent Dynamic Standing Balance Dynamic Standing - Level of Assistance: 7: Independent  Extremity Assessment  RUE Assessment RUE Assessment: Within Functional Limits RUE AROM (degrees) Right Shoulder Flexion: 150 Degrees RUE Strength RUE Overall Strength: Within Functional Limits for tasks performed LUE Assessment LUE Assessment: Within Functional Limits  Treatment consisted of higher level balance activities to determine if need for assistive device. Repeat BERG score 56/56 and DGI 21/24 demonstrating no need for assistive device. Patient ambulated in community setting (down to gift shop) 300+ feet independently. Patient ambulated up and down 22 steps with railing modified independently. Patient demonstrated independence with different gait speeds, looking vertically and left/right, stepping over and around objects, starting/stopping and turning, walking backwards and side stepping. Patient performed floor transfer independently. Met with wife and reviewed activity at discharge. She reported having no questions about patients discharge.   See FIM for current functional status  Alma Friendly 08/10/2012, 3:19 PM

## 2012-08-10 NOTE — Progress Notes (Signed)
Occupational Therapy Discharge Summary  Patient Details  Name: Ricardo Murphy MRN: [id number] Date of Birth: 06-30-1956  Today's Date: 08/10/2012  Patient has met 10 of 10 long term goals due to improved activity tolerance, improved balance, functional use of  RIGHT upper extremity and improved coordination.  Patient to discharge at overall Independent level.  Patient's care partner is independent to provide the necessary physical assistance at discharge.    Reasons goals not met: n/a  Recommendation: no further OT services required.  Pt has a HEP for right shoulder strength.  Equipment: No equipment provided  Reasons for discharge: treatment goals met  Patient/family agrees with progress made and goals achieved: Yes  OT Discharge Precautions/Restrictions  Precautions Precautions: None Restrictions Weight Bearing Restrictions: No   ADL  Refer to FIM   Vision/Perception  Vision - History Baseline Vision: Wears glasses only for reading Patient Visual Report: No change from baseline Vision - Assessment Eye Alignment: Within Functional Limits Perception Perception: Within Functional Limits Praxis Praxis: Intact  Cognition Overall Cognitive Status: Appears within functional limits for tasks assessed Orientation Level: Oriented X4 Sensation Sensation Light Touch: Appears Intact Stereognosis: Appears Intact Hot/Cold: Appears Intact Proprioception: Appears Intact Coordination Gross Motor Movements are Fluid and Coordinated: Yes Fine Motor Movements are Fluid and Coordinated: Yes Motor  Motor Motor: Hemiplegia Motor - Discharge Observations: slight weakness in RLE hip flexors Mobility  Refer to FIM    Trunk/Postural Assessment  Cervical Assessment Cervical Assessment: Within Functional Limits Thoracic Assessment Thoracic Assessment: Within Functional Limits Lumbar Assessment Lumbar Assessment: Within Functional Limits Postural Control Postural Control: Within  Functional Limits  Balance Dynamic Sitting Balance Dynamic Sitting - Level of Assistance: 7: Independent Static Standing Balance Static Standing - Level of Assistance: 7: Independent Dynamic Standing Balance Dynamic Standing - Level of Assistance: 7: Independent Extremity/Trunk Assessment RUE Assessment RUE Assessment: Within Functional Limits RUE AROM (degrees) Right Shoulder Flexion: 150 Degrees RUE Strength RUE Overall Strength: Within Functional Limits for tasks performed LUE Assessment LUE Assessment: Within Functional Limits  See FIM for current functional status  [name] 08/10/2012, 11:44 AM

## 2012-08-11 LAB — CREATININE, SERUM
GFR calc Af Amer: 77 mL/min — ABNORMAL LOW (ref >90)
GFR calc non Af Amer: 67 mL/min — ABNORMAL LOW (ref >90)

## 2012-08-11 MED ORDER — ROSUVASTATIN CALCIUM 10 MG PO TABS: 10.0000 mg | ORAL_TABLET | Freq: Every day | ORAL | Status: DC

## 2012-08-11 MED ORDER — LISINOPRIL 10 MG PO TABS: 10.0000 mg | ORAL_TABLET | Freq: Every day | ORAL | Status: DC

## 2012-08-11 MED ORDER — ALPRAZOLAM 0.25 MG PO TABS: 0.1250 mg | ORAL_TABLET | Freq: Two times a day (BID) | ORAL | Status: DC | PRN

## 2012-08-11 MED ORDER — FLUOXETINE HCL 20 MG PO CAPS: 20.0000 mg | ORAL_CAPSULE | Freq: Every day | ORAL | Status: DC

## 2012-08-11 NOTE — Progress Notes (Signed)
Patient discharge to home with wife.  Discharge instruction given by Delle Reining, PA.  Patient verbalize understanding, no further question asked.  Patient ambulate, escort off unit by rehab NT.

## 2012-08-11 NOTE — Progress Notes (Signed)
Social Work Discharge Note Discharge Note  The overall goal for the admission was met for:   Discharge location: Yes-HOME WITH WIFE  Length of Stay: Yes-7 DAYS  Discharge activity level: Yes-INDEPENDENT LEVEL  Home/community participation: Yes  Services provided included: MD, RD, PT, OT, SLP, RN, TR, Pharmacy and SW  Financial Services: Private Insurance: [facility]  Follow-up services arranged: Other: NO FOLLOW UP RECOMMENDED  Comments (or additional information):FAMILY EDUCATION COMPLETED WITH WIFE-BOTH COMFORTABLE WITH PT'S CARE WIFE HAS A NEW PRIMARY MD FOR PT-DR Tobey Bride  Patient/Family verbalized understanding of follow-up arrangements: Yes  Individual responsible for coordination of the follow-up plan: SELF & LANA-WIFE  Confirmed correct DME delivered: Lucy Chris 08/11/2012    Lucy Chris

## 2012-08-11 NOTE — Progress Notes (Signed)
Patient ID: Ricardo Murphy, male   DOB: Apr 16, 1956, 56 y.o.   MRN: [id number]  Subjective/Complaints: 56 year old male with history of HTN who was admitted to [facility] on 08/02/12 with right sided weakness, difficulty speaking and numbness right side. MRI brain done revealing acute infarct left frontoparietal region. Carotid dopplers without stenosis. He was started on ASA for CVA prophylaxis. He continues to have right hemiparesis with sensory deficits and speech has improved. Patient was advanced to D3 thin liquids. He was noted to have elevated BP and ace added for control  Excited about D/C Mod I on unit Review of Systems  Constitutional: Negative for fever.  Eyes: Negative for blurred vision.  Respiratory: Negative for cough.   Cardiovascular: Negative for leg swelling.  Musculoskeletal: Positive for joint pain.  Neurological: Positive for tingling, sensory change and focal weakness. Negative for speech change.  Psychiatric/Behavioral: Negative for depression.   Objective: Vital Signs: Blood pressure 107/56, pulse 50, temperature 97.8 F (36.6 C), temperature source Oral, resp. rate 17, SpO2 96.00%. No results found. Results for orders placed during the hospital encounter of 08/04/12 (from the past 72 hour(s))  CREATININE, SERUM     Status: Abnormal   Collection Time   08/11/12  4:40 AM      Component Value Range Comment   Creatinine, Ser 1.19  0.50 - 1.35 mg/dL    GFR calc non Af Amer 67 (*) >90 mL/min    GFR calc Af Amer 77 (*) >90 mL/min       Nursing note and vitals reviewed.  Constitutional: He is oriented to person, place, and time. He appears well-developed and well-nourished. Large frame  HENT: oral mucosa pink and moist.  Head: Normocephalic and atraumatic.  Neck: Normal range of motion. Neck supple.  Cardiovascular: Normal rate and regular rhythm. No murmur.  Pulmonary/Chest: Effort normal and breath sounds normal. No wheezes, rales, or rhonchi.  Abdominal: Soft. Bowel  sounds are normal.  Neurological: He is alert and oriented to person, place, and time.  Speech clear. Mild right central 7. Follows Commands without difficulty.  reasonable insight and awareness. Occasional word finding deficits. Right UE is 3/5 deltoid, 3-4 at bicep, tricep, 3-4 hand intrinsics. RLE is 4-/5 HF, 4-/5 KE, 4/5 at ADF, APF. Minimal sensory deficits. Good sitting balance   Assessment/Plan: 1. Functional deficits secondary to Left frontoparietal stable for D/C    PMR f/u 3-4 weeks.  PCP and Neuro f/uFIM: FIM - Bathing Bathing Steps Patient Completed: Chest;Right Arm;Left Arm;Abdomen;Front perineal area;Buttocks;Right lower leg (including foot);Left upper leg;Right upper leg;Left lower leg (including foot) Bathing: 7: Complete Independence: No helper  FIM - Upper Body Dressing/Undressing Upper body dressing/undressing steps patient completed: Thread/unthread right sleeve of pullover shirt/dresss;Thread/unthread left sleeve of pullover shirt/dress;Pull shirt over trunk;Put head through opening of pull over shirt/dress Upper body dressing/undressing: 7: Complete Independence: No helper FIM - Lower Body Dressing/Undressing Lower body dressing/undressing steps patient completed: Thread/unthread right underwear leg;Thread/unthread left underwear leg;Pull underwear up/down;Thread/unthread right pants leg;Thread/unthread left pants leg;Pull pants up/down;Don/Doff right shoe;Don/Doff left sock Lower body dressing/undressing: 7: Complete Independence: No helper  FIM - Toileting Toileting steps completed by patient: Adjust clothing prior to toileting;Performs perineal hygiene;Adjust clothing after toileting Toileting Assistive Devices: Grab bar or rail for support Toileting: 6: Assistive device: No helper  FIM - Diplomatic Services operational officer Devices: Therapist, music Transfers: 7-Independent: No helper  FIM - Banker Devices:  Manufacturing systems engineer Transfer: 7: Independent: No helper;7: Sit >  Supine: No assist;7: Supine > Sit: No assist;7: Bed > Chair or W/C: No assist;7: Chair or W/C > Bed: No assist  FIM - Locomotion: Wheelchair Distance: 120 Locomotion: Wheelchair: 0: Activity did not occur FIM - Locomotion: Ambulation Locomotion: Ambulation Assistive Devices: Designer, industrial/product Ambulation/Gait Assistance: 7: Independent Locomotion: Ambulation: 7: Travels 150 ft or more independently  Comprehension Comprehension Mode: Auditory Comprehension: 7-Follows complex conversation/direction: With no assist  Expression Expression Mode: Verbal Expression: 7-Expresses complex ideas: With no assist  Social Interaction Social Interaction: 7-Interacts appropriately with others - No medications needed.  Problem Solving Problem Solving: 7-Solves complex problems: Recognizes & self-corrects  Memory Memory: 7-Complete Independence: No helper  Medical Problem List and Plan:  1. DVT Prophylaxis/Anticoagulation: Pharmaceutical: Lovenox  2. Pain Management: N/A  3. Mood: Was started on Prozac for depressed mood. He seems to have a positive outlook at current time. Will monitor for now. May need Xanax on occasion 4. Neuropsych: This patient is capable of making decisions on his/her own behalf.  5. HTN: monitor with bid checks. Continue lisinopril  6. Dyslipidemia: crestor started today. Will monitor for tolerance/SE.  7. Stroke recurrent but without new clinical symptoms while on prophylaxis with ECASA 325mg  qday. Repeat MRI demonstrated a new 1 cm infarct in the R parietal area compared to 12/9. ECHO no source of embolism   LOS (Days) 7 A FACE TO FACE EVALUATION WAS PERFORMED  KIRSTEINS,ANDREW E 08/11/2012, 9:24 AM

## 2012-08-11 NOTE — Patient Care Conference (Signed)
Inpatient RehabilitationTeam Conference Note Date: 08/11/2012   Time: 11:20 AM    Patient Name: Ricardo Murphy      Medical Record Number: [id number]  Date of Birth: Dec 27, 1955 Sex: Male         Room/Bed: 4144/4144-01 Payor Info: Payor: Advertising copywriter  Plan: [employer]  Product Type: *No Product type*     Admitting Diagnosis: LT CVA  Admit Date/Time:  08/04/2012  4:39 PM Admission Comments: No comment available   Primary Diagnosis:  CVA (cerebral infarction) Principal Problem: CVA (cerebral infarction)  Patient Active Problem List   Diagnosis Date Noted  . CVA (cerebral infarction) 08/05/2012  . HTN (hypertension) 08/05/2012  . Dyslipidemia 08/05/2012    Expected Discharge Date: Expected Discharge Date: 08/11/12  Team Members Present: Physician leading conference: Dr. Claudette Laws Social Worker Present: Dossie Der, LCSW Nurse Present: Other (comment) Milly Jakob Rcom-RN) PT Present: Edman Circle, PT;Becky Henrene Dodge, PT OT Present: Bretta Bang, OT SLP Present: Fae Pippin, SLP     Current Status/Progress Goal Weekly Team Focus  Medical     Medically stable   Medically stable     Bowel/Bladder   Pt continent of bowel and bladder         Swallow/Nutrition/ Hydration     na   na     ADL's     independent with all ADL's   independent level     Mobility   independent with ambulation,  high level balance activities and up/down 22 steps  modified independent  patient has surpassed goals and is ready for discharge; patient/family education completed   Communication   Mod I   Mod I   goals met   Safety/Cognition/ Behavioral Observations  Mod I   Mod I   goals met   Pain   Pt states he has a headache 5/10 at times. Tylenol 650 q4 hrs PRN effective.   3 or less  assess and monitor pain q shift and medicate as needed.   Skin   CDI            *See Interdisciplinary Assessment and Plan and progress notes for long and short-term goals  Barriers to  Discharge:   na   Possible Resolutions to Barriers:     na  Discharge Planning/Teaching Needs:  Pt reached independent level goals and wife in for family educaiton ready for discharge      Team Discussion:  Pt exceeded his goals and discharged at independent level with wife.  Revisions to Treatment Plan:  None      Catrice Zuleta, Lemar Livings 08/11/2012, 11:43 AM

## 2012-08-12 NOTE — Progress Notes (Signed)
Discharge summary [phone]

## 2012-08-13 NOTE — Discharge Summary (Signed)
NAMEEBRAHIM, DEREMER NO.:  [id number]  MEDICAL RECORD NO.:  [id number]  LOCATION:  4144                         FACILITY:  [facility]  PHYSICIAN:  Erick Colace, M.D.DATE OF BIRTH:  08-Mar-1956  DATE OF ADMISSION:  08/04/2012 DATE OF DISCHARGE:  08/11/2012                              DISCHARGE SUMMARY   DISCHARGE DIAGNOSES: 1. Left frontoparietal infarct. 2. Hypertension. 3. Dyslipidemia. 4. Depression with anxiety features.  HISTORY OF PRESENT ILLNESS:  Mr. Ricardo Murphy is a 56 year old male with history of dyslipidemia who was admitted to [facility] on August 02, 2012 with right-sided weakness, difficulty speaking, and numbness.  MRI of brain done showed acute infarcts at left frontoparietal region.  Carotid Dopplers done showed no ICA stenosis. The patient was started on aspirin for CVA prophylaxis.  He was noted to have depressed mood and was started on Prozac to help with mood stabilization.  The patient continued to have deficits due to right hemiparesis and sensory deficits.  Speech was improving.  Ace was added due to issues with elevated blood pressure.  Therapies are ongoing and therapy team recommended CIR for progressive therapies.  Therefore, the patient was admitted for CIR program.  PAST MEDICAL HISTORY:  Multiple allergies, dyslipidemia, history of sinus infection a few months ago, and history of pneumonia 4 weeks ago.  FUNCTIONAL HISTORY:  The patient was independent and working prior to admission.  FUNCTIONAL STATUS:  The patient is contact guard assist for sitting at the edge of bed, mod assist x2 for standing and weightbearing through right lower extremity.  LABORATORY TESTS:  Check of lytes from August 04, 2012 revealed sodium 138, potassium 3.8, chloride 100, CO2 of 29, BUN 12, creatinine 1.13, glucose 93.  CBC revealed hemoglobin 16.0, hematocrit 46.5, white count 7.8, and platelets 265.  Hemoglobin A1c was normal at  5.5.  Lipid panel revealed cholesterol 195, triglycerides 123, HDL 48, LDL 132, VLDL 25.  RADIOLOGICAL TESTS:  MRI and MRA of brain revealed restricted left parietal lobe and 1 cm compatible with acute infarct and left parietal white matter acute infarcts.  MRA negative for stenosis.  Cardiac Studies:  2D echo done revealed EF of 60% to 65% with grade 1 diastolic dysfunction.  Normal wall thickness.  No valvular disease.  HOSPITAL COURSE:  Mr. Ricardo Murphy was admitted to Rehab on August 04, 2012, for inpatient therapies to consist of PT, OT, and speech therapy at least 3 hours 5 days a week.  Past admission physiatrist, rehab, RN, and therapy team have worked together to provide customized collaborative interdisciplinary care.  Rehab RN has worked with the patient on bowel and bladder program as well as med administration. Neurology was consulted for input to complete regarding stroke workup. They recommended a lipid panel, echo, as well as MRI and this was done. The patient recommended continuing aspirin daily for CVA prophylaxis. The patient was started on Crestor prior to his admission in rehab. This was maintained due to his dyslipidemia.  He did report some itching past administration of Crestor.  Benadryl was scheduled 30 minutes prior to his medication and this has helped relieve his symptoms.    The patient's  blood pressures were monitored on b.i.d. basis.  These have been well controlled. The patient's p.o. intake has been good.  He has been continent of bowel and bladder.  He has had some issues with anxiety which was treated with p.r.n. use of Xanax.  The patient has made good progress during the stay.  He is showing improvement in strength and functional use of right upper and right lower extremities.  He was independent for transfers and ambulation of 300 feet without assistive device.  Overall balance is greatly improved with Berg score at 56/56, DGI score at 21/24.   He was able to navigate 24 steps with railing at modified independent level.  OT has worked with the patient on self-care tasks.  The patient was modified independent for bathing and dressing tasks. No further followup therapies are recommended.  Wife will provide supervision as needed past discharge.  The patient has been advised about not drive until followup with MD.  On August 11, 2012, the patient is discharged to home in improved condition.  DISCHARGE MEDICATIONS: 1. Xanax 0.25 mg half to one p.o. b.i.d. p.r.n. anxiety. 2. Coated aspirin 325 mg p.o. per day. 3. Colace 100 mg p.o. b.i.d. 4. Prozac 20 mg p.o. per day. 5. Lisinopril 10 mg p.o. per day. 6. Crestor 10 mg p.o. at bedtime.  May take Benadryl 25 mg 30 minutes     prior to taking Crestor.  ACTIVITY LEVEL:  As tolerated.  DIET:  Low fat, low cholesterol.  SPECIAL INSTRUCTIONS:  Information given about CVA support group.  Wife is to set the patient up with Dr. Tobey Bride in [facility] as primary care physician.  Follow up with Dr. Wynn Banker on September 10, 2012 at 3:30 p.m.     Ricardo Murphy, P.A.   ______________________________ Erick Colace, M.D.    PL/MEDQ  D:  08/12/2012  T:  08/13/2012  Job:  [id number]  cc:   Tobey Bride, MD

## 2012-09-10 ENCOUNTER — Ambulatory Visit (HOSPITAL_BASED_OUTPATIENT_CLINIC_OR_DEPARTMENT_OTHER): Payer: 59 | Admitting: Physical Medicine & Rehabilitation

## 2012-09-10 ENCOUNTER — Emergency Department (INDEPENDENT_AMBULATORY_CARE_PROVIDER_SITE_OTHER)
Admission: EM | Admit: 2012-09-10 | Discharge: 2012-09-10 | Disposition: A | Discharge status: Home / Self Care | Payer: 59 | Source: Home / Self Care

## 2012-09-10 ENCOUNTER — Encounter: Payer: Self-pay | Admitting: Physical Medicine & Rehabilitation

## 2012-09-10 ENCOUNTER — Encounter (HOSPITAL_COMMUNITY): Payer: Self-pay | Admitting: *Deleted

## 2012-09-10 ENCOUNTER — Encounter: Payer: 59 | Attending: Physical Medicine & Rehabilitation

## 2012-09-10 VITALS — BP 126/70 | HR 69 | Resp 14 | Wt 274.8 lb

## 2012-09-10 DIAGNOSIS — L270 Generalized skin eruption due to drugs and medicaments taken internally: Secondary | ICD-10-CM | POA: Insufficient documentation

## 2012-09-10 DIAGNOSIS — L500 Allergic urticaria: Secondary | ICD-10-CM

## 2012-09-10 DIAGNOSIS — Z8673 Personal history of transient ischemic attack (TIA), and cerebral infarction without residual deficits: Secondary | ICD-10-CM | POA: Insufficient documentation

## 2012-09-10 DIAGNOSIS — T466X5A Adverse effect of antihyperlipidemic and antiarteriosclerotic drugs, initial encounter: Secondary | ICD-10-CM | POA: Insufficient documentation

## 2012-09-10 DIAGNOSIS — R29898 Other symptoms and signs involving the musculoskeletal system: Secondary | ICD-10-CM | POA: Insufficient documentation

## 2012-09-10 DIAGNOSIS — I69993 Ataxia following unspecified cerebrovascular disease: Secondary | ICD-10-CM

## 2012-09-10 DIAGNOSIS — M7989 Other specified soft tissue disorders: Secondary | ICD-10-CM | POA: Insufficient documentation

## 2012-09-10 MED ORDER — METHYLPREDNISOLONE ACETATE 80 MG/ML IJ SUSP
80.0000 mg | Freq: Once | INTRAMUSCULAR | Status: AC
  Administered 2012-09-10: 80 mg via INTRAMUSCULAR

## 2012-09-10 MED ORDER — PREDNISONE 10 MG PO KIT: PACK | ORAL | Status: DC

## 2012-09-10 MED ORDER — METHYLPREDNISOLONE ACETATE 80 MG/ML IJ SUSP
INTRAMUSCULAR | Status: AC
  Filled 2012-09-10: qty 1

## 2012-09-10 MED ORDER — ALPRAZOLAM 0.5 MG PO TABS: 0.5000 mg | ORAL_TABLET | Freq: Every evening | ORAL | Status: DC | PRN

## 2012-09-10 MED ORDER — HYDROXYZINE HCL 25 MG PO TABS: 25.0000 mg | ORAL_TABLET | Freq: Four times a day (QID) | ORAL | Status: DC | PRN

## 2012-09-10 NOTE — Patient Instructions (Signed)
Recommend going to the urgent care to be evaluated for allergic dermatitis likely related to Crestor prescribed Ambien for sleep,Take for the next 2 weeks to improve your sleep cycle

## 2012-09-10 NOTE — ED Notes (Signed)
Started back on statin drug Crestor 5 weeks ago with a Benadryl.   He broke out in rash on L forearm and R upper arm with itching onset 2 days ago.  Also with swelling in left hand and fingers.  Some of areas are oozing serous fluid on L forearm.  C/o it feeling hot and burning from the inside out.

## 2012-09-10 NOTE — Progress Notes (Signed)
Subjective:    Patient ID: Ricardo Murphy, male    DOB: July 06, 1956, 57 y.o.   MRN: [id number] Mr. Nhan Qualley is a 57 year old male with  history of dyslipidemia who was admitted to [facility] on  August 02, 2012 with right-sided weakness, difficulty speaking, and  numbness. MRI of brain done showed acute infarcts at left  frontoparietal region. Carotid Dopplers done showed no ICA stenosis.  The patient was started on aspirin for CVA prophylaxis. He was noted to  have depressed mood and was started on Prozac to help with mood  Stabilization CIR stay at [facility] DATE OF ADMISSION: 08/04/2012  DATE OF DISCHARGE: 08/11/2012  HPI Rash Left arm now spreading to R side,Onset 2 days ago No shortness of breath no chest pains no swallowing difficulty. Denies any truncal rash or leg rash. Quit taking Crestor 2 days ago Quit taking Prozac 4 weeks ago Continues on Zestril Has a new doctor and asked for a [facility] Maisie Fus Pain Inventory Average Pain 0 Pain Right Now 0 My pain is no pain  In the last 24 hours, has pain interfered with the following? General activity 0 Relation with others 0 Enjoyment of life 0 What TIME of day is your pain at its worst? no pain Sleep (in general) Fair  Pain is worse with: no pain Pain improves with: no pain Relief from Meds: no pain medication  Mobility walk without assistance ability to climb steps?  yes do you drive?  yes  Function disabled: date disabled 2013  Neuro/Psych weakness confusion anxiety loss of taste or smell  Prior Studies Any changes since last visit?  no  Physicians involved in your care Any changes since last visit?  no   Family History  Problem Relation Age of Onset  . Cancer - Other Mother   . Diabetes Mother   . Valvular heart disease Mother   . CVA Father    History   Social History  . Marital Status: Married    Spouse Name: N/A    Number of Children: N/A  . Years of Education:  N/A   Social History Main Topics  . Smoking status: None  . Smokeless tobacco: None  . Alcohol Use:   . Drug Use:   . Sexually Active:    Other Topics Concern  . None   Social History Narrative  . None   Past Surgical History  Procedure Date  . Appendectomy   . Knee arthroscopy     left  . Shoulder arthroscopy with biceps tendon repair     right   Past Medical History  Diagnosis Date  . Multiple allergies   . Dyslipidemia   . Sinus infection     a couple of months ago  . Pneumonia     4 weeks ago   BP 126/70  Pulse 69  Resp 14  Wt 274 lb 12.8 oz (124.648 kg)  SpO2 95%   Review of Systems  Constitutional: Positive for appetite change.  Neurological: Positive for weakness.  Psychiatric/Behavioral: Positive for confusion. The patient is nervous/anxious.   All other systems reviewed and are negative.       Objective:   Physical Exam  Constitutional: He is oriented to person, place, and time.  Neurological: He is alert and oriented to person, place, and time. No sensory deficit.       Strength is 4/5 in the left deltoid, biceps, triceps, grip right side is 5/5 Both lower limbs are  5/5 Ambulation is normal Romberg is positive  Skin: Rash noted. Rash is vesicular and urticarial. There is erythema.  Psychiatric: He has a normal mood and affect. His speech is normal and behavior is normal. Judgment and thought content normal. Cognition and memory are normal.          Assessment & Plan:  1. Left frontal parietal infarct with right Hemiparesis resolved Now with allergic dermatitis secondary to Crestor affecting primarily the left arm and forearm. His left-sided weakness is due to swelling. I do not think this represents a new neurologic event. Directed patient to urgent care he'll likely need corticosteroids IV versus IM plus oral corticosteroids List Crestor as allergy RTC 1 month

## 2012-09-10 NOTE — ED Provider Notes (Signed)
Medical screening examination/treatment/procedure(s) were performed by resident physician or non-physician practitioner and as supervising physician I was immediately available for consultation/collaboration.   Barkley Bruns MD.    Linna Hoff, MD 09/10/12 2021

## 2012-09-10 NOTE — ED Provider Notes (Signed)
Ricardo Murphy is a 57 y.o. male who presents to Urgent Care today for drug rash.   Patient notes rash on left forearm and right upper arm starting about 2 days ago.  He notes this rash is consistent with prior episodes of drug rash with statins.  He has had a rash with Lipitor and Zocor in the past. He had a stroke 5 weeks ago and was started on Crestor.  He stopped taking the Crestor 2 days ago when he noted the rash initially. He notes the rash on his left arm has worsened by becoming darker or at and now with occasional fluid seepage and vesicles.  He notes that the rash is itchy but not painful. Additionally he tried applying compression Ace bandage and notes no hand swelling today. Additionally he notes a smaller rash on his right upper arm starting at the same time. It has not worsened.   He denies any fevers or chills, flulike illness, mucocutaneous involvement, mouth swelling or trouble breathing.    PMH reviewed. Recent stroke History  Substance Use Topics  . Smoking status: Not on file  . Smokeless tobacco: Not on file  . Alcohol Use:    ROS as above Medications reviewed. No current facility-administered medications for this encounter.   Current Outpatient Prescriptions  Medication Sig Dispense Refill  . ALPRAZolam (XANAX) 0.5 MG tablet Take 1 tablet (0.5 mg total) by mouth at bedtime as needed for sleep.  15 tablet  0  . aspirin EC 325 MG tablet Take 325 mg by mouth daily.      Marland Kitchen docusate sodium (COLACE) 100 MG capsule Take 100 mg by mouth 2 (two) times daily.      Marland Kitchen lisinopril (PRINIVIL,ZESTRIL) 10 MG tablet Take 1 tablet (10 mg total) by mouth daily.  30 tablet  1    Exam:  BP 118/79  Pulse 72  Temp 98.7 F (37.1 C) (Oral)  Resp 20  SpO2 97% Gen: Well NAD HEENT: EOMI,  MMM, no mucocutaneous lesions.  Lungs: CTABL Nl WOB Heart: RRR no MRG Abd: NABS, NT, ND Exts:  warm and well perfused.  Skin: Large erythematous with vesicles plaque on left forearm from elbow to  wrist.  The rash is confined to the dorsal forearm.  Additionally there are vesicles and fluid seepage. There is no tenderness to palpation. He has a smaller lightly erythematous maculopapular rash on right elbow upper forearm area.   Please see pictures below:    Left Arm:   Right Arm:  Assessment and Plan:  57 y.o. male with drug rash likely secondary to statin.   Patient has no mucocutaneous involvement, no systemic signs or symptoms, and does not have extensive body surface area. I feel his drug rash it is reasonable for outpatient management over the weekend .  Plan : Depo-Medrol 80mg  IM tonight   12 day 10 mg Sterapred Dosepak Hydroxyzine for itching as needed.  Close followup if worsening.  Discussed warning signs or symptoms. Please see discharge instructions. Patient expresses understanding.  Patient will return on Saturday or Sunday if worsening.  If not improved by Monday patient will followup with his primary care provider.      Rodolph Bong, MD 09/10/12 1751  Rodolph Bong, MD 09/10/12 1758

## 2012-09-14 ENCOUNTER — Telehealth: Payer: Self-pay

## 2012-09-14 NOTE — Telephone Encounter (Signed)
Patients wife called because at his last appointment he was supposed to have medication sent to the pharmacy, but they have not received anything.  She did not state what medication.  Looks like this may be Ambien?  Please advise.

## 2012-09-15 ENCOUNTER — Telehealth: Payer: Self-pay | Admitting: *Deleted

## 2012-09-15 MED ORDER — ALPRAZOLAM 0.5 MG PO TABS: 0.5000 mg | ORAL_TABLET | Freq: Every evening | ORAL | Status: DC | PRN

## 2012-09-15 NOTE — Telephone Encounter (Signed)
Xanax reordered. 

## 2012-09-15 NOTE — Telephone Encounter (Signed)
Ricardo Murphy says Dr Wynn Banker did not call in the alprazolam like he said he was going to.  rx called to pharmacy.

## 2012-10-11 ENCOUNTER — Encounter: Payer: Self-pay | Admitting: Physical Medicine & Rehabilitation

## 2012-10-11 ENCOUNTER — Ambulatory Visit (HOSPITAL_BASED_OUTPATIENT_CLINIC_OR_DEPARTMENT_OTHER): Payer: 59 | Admitting: Physical Medicine & Rehabilitation

## 2012-10-11 ENCOUNTER — Encounter: Payer: 59 | Attending: Physical Medicine & Rehabilitation

## 2012-10-11 VITALS — BP 126/67 | HR 80 | Resp 14 | Ht 71.0 in | Wt 273.0 lb

## 2012-10-11 DIAGNOSIS — I69919 Unspecified symptoms and signs involving cognitive functions following unspecified cerebrovascular disease: Secondary | ICD-10-CM

## 2012-10-11 DIAGNOSIS — M7989 Other specified soft tissue disorders: Secondary | ICD-10-CM | POA: Insufficient documentation

## 2012-10-11 DIAGNOSIS — Z8673 Personal history of transient ischemic attack (TIA), and cerebral infarction without residual deficits: Secondary | ICD-10-CM | POA: Insufficient documentation

## 2012-10-11 DIAGNOSIS — I69998 Other sequelae following unspecified cerebrovascular disease: Secondary | ICD-10-CM

## 2012-10-11 DIAGNOSIS — R29898 Other symptoms and signs involving the musculoskeletal system: Secondary | ICD-10-CM | POA: Insufficient documentation

## 2012-10-11 DIAGNOSIS — T466X5A Adverse effect of antihyperlipidemic and antiarteriosclerotic drugs, initial encounter: Secondary | ICD-10-CM | POA: Insufficient documentation

## 2012-10-11 DIAGNOSIS — L270 Generalized skin eruption due to drugs and medicaments taken internally: Secondary | ICD-10-CM | POA: Insufficient documentation

## 2012-10-11 NOTE — Patient Instructions (Addendum)
DR. Aura Camps Neuro opthalmologist if vision problems do not improve by June 2014 Call in November if you need an appointment for work clearance

## 2012-10-11 NOTE — Progress Notes (Signed)
Subjective:    Patient ID: Ricardo Murphy, male    DOB: [birth year], 58 y.o.   MRN: [id number]  HPI Ricardo Murphy is a 56 year old male with  history of dyslipidemia who was admitted to [facility] on  August 02, 2012 with right-sided weakness, difficulty speaking, and  numbness. MRI of brain done showed acute infarcts at left  frontoparietal region. Carotid Dopplers done showed no ICA stenosis.  The patient was started on aspirin for CVA prophylaxis. He was noted to  have depressed mood and was started on Prozac to help with mood  Stabilization  CIR stay at [facility]  DATE OF ADMISSION: 08/04/2012  DATE OF DISCHARGE: 08/11/2012 Concern about right eye watering. Seen by ophthalmologist no abnormalities were noted on eye exam .Concern about rash on right arm. Started on Zetia and Benicar Will be in his primary care physician's office tomorrow Pain Inventory Average Pain 0 Pain Right Now 0 My pain is no pain  In the last 24 hours, has pain interfered with the following? General activity 0 Relation with others 0 Enjoyment of life 0 What TIME of day is your pain at its worst? no pain Sleep (in general) Fair  Pain is worse with: no pain Pain improves with: no pain Relief from Meds: no pain med  Mobility walk without assistance how many minutes can you walk? 30 ability to climb steps?  yes do you drive?  yes  Function Do you have any goals in this area?  no  Neuro/Psych No problems in this area  Prior Studies Any changes since last visit?  no  Physicians involved in your care Any changes since last visit?  no   Family History  Problem Relation Age of Onset  . Cancer - Other Mother   . Diabetes Mother   . Valvular heart disease Mother   . CVA Father    History   Social History  . Marital Status: Married    Spouse Name: N/A    Number of Children: N/A  . Years of Education: N/A   Social History Main Topics  . Smoking status: Never Smoker   .  Smokeless tobacco: None  . Alcohol Use: No  . Drug Use: No  . Sexually Active:    Other Topics Concern  . None   Social History Narrative  . None   Past Surgical History  Procedure Laterality Date  . Appendectomy    . Knee arthroscopy      left  . Shoulder arthroscopy with biceps tendon repair      right   Past Medical History  Diagnosis Date  . Multiple allergies   . Dyslipidemia   . Sinus infection     a couple of months ago  . Pneumonia     4 weeks ago   BP 126/67  Pulse 80  Resp 14  Ht 5\' 11"  (1.803 m)  Wt 273 lb (123.832 kg)  BMI 38.09 kg/m2  SpO2 94%     Review of Systems  All other systems reviewed and are negative.       Objective:   Physical Exam Constitutional: He is oriented to person, place, and time.  Neurological: He is alert and oriented to person, place, and time. No sensory deficit.  Strength is 5/5 in the left deltoid, biceps, triceps, grip right side is 5/5 Both lower limbs are 5/5 Ambulation is normal Romberg is negative  Skin: Rash noted. Right medial arm maculopapular There is erythema.  Psychiatric:  He has a normal mood and affect. His speech is normal and behavior is normal. Judgment and thought content normal. Cognition and memory  Eye noninjected, extraocular movements intact, no evidence of nystagmus       Assessment & Plan:  1. Left frontal parietal infarct with right Hemiparesis resolved  2. Skin rash may be drug allergy certainly does not appear to be progressing fairly isolated. Possible is that he allergy although he's been on this in the past without issues. He will show this to his primary physician tomorrow 3. Visual complaints watering eye does not appear to be a stroke related issue based on his lesion and symptoms.If he has persistent visual complaints at the 6 month mark I have recommended neuro ophthalmology evaluation.This made be more of a sensory problem Discussed with patient and his wife agree with plan He  would like to get back to work in one year from stroke. Will call for an appointment November of this year 4. Mild memory impairment should improve over the next several months. No specific treatment recommended

## 2013-06-14 ENCOUNTER — Encounter: Payer: 59 | Attending: Physical Medicine & Rehabilitation

## 2013-06-14 ENCOUNTER — Encounter: Payer: Self-pay | Admitting: Physical Medicine & Rehabilitation

## 2013-06-14 ENCOUNTER — Ambulatory Visit (HOSPITAL_BASED_OUTPATIENT_CLINIC_OR_DEPARTMENT_OTHER): Payer: 59 | Admitting: Physical Medicine & Rehabilitation

## 2013-06-14 VITALS — BP 118/63 | HR 62 | Resp 16 | Ht 71.0 in | Wt 281.0 lb

## 2013-06-14 DIAGNOSIS — I639 Cerebral infarction, unspecified: Secondary | ICD-10-CM

## 2013-06-14 DIAGNOSIS — I69993 Ataxia following unspecified cerebrovascular disease: Secondary | ICD-10-CM

## 2013-06-14 DIAGNOSIS — I69998 Other sequelae following unspecified cerebrovascular disease: Secondary | ICD-10-CM | POA: Insufficient documentation

## 2013-06-14 DIAGNOSIS — I69959 Hemiplegia and hemiparesis following unspecified cerebrovascular disease affecting unspecified side: Secondary | ICD-10-CM | POA: Insufficient documentation

## 2013-06-14 DIAGNOSIS — F411 Generalized anxiety disorder: Secondary | ICD-10-CM | POA: Insufficient documentation

## 2013-06-14 DIAGNOSIS — F329 Major depressive disorder, single episode, unspecified: Secondary | ICD-10-CM

## 2013-06-14 DIAGNOSIS — H539 Unspecified visual disturbance: Secondary | ICD-10-CM | POA: Insufficient documentation

## 2013-06-14 DIAGNOSIS — IMO0002 Reserved for concepts with insufficient information to code with codable children: Secondary | ICD-10-CM

## 2013-06-14 DIAGNOSIS — E785 Hyperlipidemia, unspecified: Secondary | ICD-10-CM | POA: Insufficient documentation

## 2013-06-14 DIAGNOSIS — R279 Unspecified lack of coordination: Secondary | ICD-10-CM | POA: Insufficient documentation

## 2013-06-14 DIAGNOSIS — I6350 Cerebral infarction due to unspecified occlusion or stenosis of unspecified cerebral artery: Secondary | ICD-10-CM

## 2013-06-14 DIAGNOSIS — F3289 Other specified depressive episodes: Secondary | ICD-10-CM | POA: Insufficient documentation

## 2013-06-14 DIAGNOSIS — R413 Other amnesia: Secondary | ICD-10-CM | POA: Insufficient documentation

## 2013-06-14 HISTORY — DX: Reserved for concepts with insufficient information to code with codable children: IMO0002

## 2013-06-14 HISTORY — DX: Ataxia following unspecified cerebrovascular disease: I69.993

## 2013-06-14 MED ORDER — SERTRALINE HCL 50 MG PO TABS: 50.0000 mg | ORAL_TABLET | Freq: Every day | ORAL | Status: DC

## 2013-06-14 NOTE — Patient Instructions (Signed)
Sertraline tablets What is this medicine? SERTRALINE (SER tra leen) is used to treat depression. It may also be used to treat obsessive compulsive disorder, panic disorder, post-trauma stress, premenstrual dysphoric disorder (PMDD) or social anxiety. This medicine may be used for other purposes; ask your health care provider or pharmacist if you have questions. What should I tell my health care provider before I take this medicine? They need to know if you have any of these conditions: -bipolar disorder or a family history of bipolar disorder -diabetes -heart disease -liver disease -receiving electroconvulsive therapy -seizures (convulsions) -suicidal thoughts, plans, or attempt by you or a family member -an unusual or allergic reaction to sertraline, other medicines, foods, dyes, or preservatives -pregnant or trying to get pregnant -breast-feeding How should I use this medicine? Take this medicine by mouth with a glass of water. Follow the directions on the prescription label. You can take it with or without food. Take your medicine at regular intervals. Do not take your medicine more often than directed. Do not stop taking this medicine suddenly except upon the advice of your doctor. Stopping this medicine too quickly may cause serious side effects or your condition may worsen. A special MedGuide will be given to you by the pharmacist with each prescription and refill. Be sure to read this information carefully each time. Talk to your pediatrician regarding the use of this medicine in children. While this drug may be prescribed for children as young as 7 years for selected conditions, precautions do apply. Overdosage: If you think you have taken too much of this medicine contact a poison control center or emergency room at once. NOTE: This medicine is only for you. Do not share this medicine with others. What if I miss a dose? If you miss a dose, take it as soon as you can. If it is almost  time for your next dose, take only that dose. Do not take double or extra doses. What may interact with this medicine? Do not take this medicine with any of the following medications: -linezolid -MAOIs like Carbex, Eldepryl, Marplan, Nardil, and Parnate -methylene blue (injected into a vein) -pimozide -thioridazine This medicine may also interact with the following medications: -alcohol -aspirin and aspirin-like medicines -certain medicines for depression, anxiety, or psychotic disturbances -certain medicines for migraine headaches like almotriptan, eletriptan, frovatriptan, naratriptan, rizatriptan, sumatriptan, zolmitriptan -certain medicines for seizures like carbamazepine, valproic acid, phenytoin -cimetidine -digoxin -diuretics -fentanyl -furazolidone -isoniazid -lithium -medicines that treat or prevent blood clots like warfarin, enoxaparin, and dalteparin -medicines to control heart rhythm like flecainide or propafenone -medicines for sleep -NSAIDs, medicines for pain and inflammation, like ibuprofen or naproxen -procarbazine -rasagiline -supplements like St. John's wort, kava kava, valerian -tolbutamide -tramadol -tryptophan This list may not describe all possible interactions. Give your health care provider a list of all the medicines, herbs, non-prescription drugs, or dietary supplements you use. Also tell them if you smoke, drink alcohol, or use illegal drugs. Some items may interact with your medicine. What should I watch for while using this medicine? Tell your doctor if your symptoms do not get better or if they get worse. Visit your doctor or health care professional for regular checks on your progress. Because it may take several weeks to see the full effects of this medicine, it is important to continue your treatment as prescribed by your doctor. Patients and their families should watch out for new or worsening thoughts of suicide or depression. Also watch out for  sudden changes in  feelings such as feeling anxious, agitated, panicky, irritable, hostile, aggressive, impulsive, severely restless, overly excited and hyperactive, or not being able to sleep. If this happens, especially at the beginning of treatment or after a change in dose, call your health care professional. Bonita Quin may get drowsy or dizzy. Do not drive, use machinery, or do anything that needs mental alertness until you know how this medicine affects you. Do not stand or sit up quickly, especially if you are an older patient. This reduces the risk of dizzy or fainting spells. Alcohol may interfere with the effect of this medicine. Avoid alcoholic drinks. Your mouth may get dry. Chewing sugarless gum or sucking hard candy, and drinking plenty of water may help. Contact your doctor if the problem does not go away or is severe. What side effects may I notice from receiving this medicine? Side effects that you should report to your doctor or health care professional as soon as possible: -allergic reactions like skin rash, itching or hives, swelling of the face, lips, or tongue -black or bloody stools, blood in the urine or vomit -fast, irregular heartbeat -feeling faint or lightheaded, falls -hallucination, loss of contact with reality -seizures -suicidal thoughts or other mood changes -unusual bleeding or bruising -unusually weak or tired -vomiting Side effects that usually do not require medical attention (report to your doctor or health care professional if they continue or are bothersome): -change in appetite -change in sex drive or performance -diarrhea -increased sweating -indigestion, nausea -tremors This list may not describe all possible side effects. Call your doctor for medical advice about side effects. You may report side effects to FDA at 1-800-FDA-1088. Where should I keep my medicine? Keep out of the reach of children. Store at room temperature between 15 and 30 degrees C (59  and 86 degrees F). Throw away any unused medicine after the expiration date. NOTE: This sheet is a summary. It may not cover all possible information. If you have questions about this medicine, talk to your doctor, pharmacist, or health care provider.  2013, [employer]. (12/26/2011 8:59:59 PM)

## 2013-06-14 NOTE — Progress Notes (Signed)
Subjective:    Patient ID: Ricardo Murphy, male    DOB: [birth year], 57 y.o.   MRN: [id number]  HPI Ricardo Murphy is a 57 year old male with  history of dyslipidemia who was admitted to [facility] on  August 02, 2012 with right-sided weakness, difficulty speaking, and  numbness. MRI of brain done showed acute infarcts at left  frontoparietal region. Carotid Dopplers done showed no ICA stenosis.  The patient was started on aspirin for CVA prophylaxis. He was noted to  have depressed mood and was started on Prozac to help with mood  Stabilization  CIR stay at [facility]  DATE OF ADMISSION: 08/04/2012  DATE OF DISCHARGE: 08/11/2012   Still has blind spot L temporal visual field Advised no driving Also has balance problems Pain Inventory Average Pain 0 Pain Right Now 0 My pain is n/a  In the last 24 hours, has pain interfered with the following? General activity 0 Relation with others 0 Enjoyment of life 0 What TIME of day is your pain at its worst? n/a Sleep (in general) Fair  Pain is worse with: n/a Pain improves with: n/a Relief from Meds: n/a  Mobility use a cane ability to climb steps?  yes do you drive?  yes  Function not employed: date last employed 12/14  Neuro/Psych trouble walking confusion anxiety  Prior Studies x-rays CT/MRI  Physicians involved in your care Any changes since last visit?  no   Family History  Problem Relation Age of Onset  . Cancer - Other Mother   . Diabetes Mother   . Valvular heart disease Mother   . CVA Father    History   Social History  . Marital Status: Married    Spouse Name: N/A    Number of Children: N/A  . Years of Education: N/A   Social History Main Topics  . Smoking status: Never Smoker   . Smokeless tobacco: None  . Alcohol Use: No  . Drug Use: No  . Sexual Activity:    Other Topics Concern  . None   Social History Narrative  . None   Past Surgical History  Procedure Laterality Date   . Appendectomy    . Knee arthroscopy      left  . Shoulder arthroscopy with biceps tendon repair      right   Past Medical History  Diagnosis Date  . Multiple allergies   . Dyslipidemia   . Sinus infection     a couple of months ago  . Pneumonia     4 weeks ago   BP 118/63  Pulse 62  Resp 16  Ht 5\' 11"  (1.803 m)  Wt 281 lb (127.461 kg)  BMI 39.21 kg/m2  SpO2 98%     Review of Systems  Respiratory: Positive for shortness of breath.   Musculoskeletal: Positive for gait problem.  Psychiatric/Behavioral: Positive for confusion. The patient is nervous/anxious.   All other systems reviewed and are negative.       Objective:   Physical Exam Physical Exam  Constitutional: He is oriented to person, place, and time.  Neurological: He is alert and oriented to person, place, and time. No sensory deficit.  Strength is 5/5 in the left deltoid, biceps, triceps, grip right side is 5/5 Both lower limbs are 5/5 Ambulation is normal Romberg is negative  Skin: Rash noted. Right medial arm maculopapular There is erythema.  Psychiatric: He has a normal mood and affect. His speech is normal and behavior is  normal. Judgment and thought content normal. Cognition and memory  Eye noninjected, extraocular movements intact, no evidence of nystagmus Left visual field cut on confrontational testing Cannot subtract serial 7s      Assessment & Plan:  1. Left frontal parietal infarct with right Hemiparesis resolved , still with ataxia and balance disorder  2. Visual complaints  Left field cut, not explained by his R frontal CVA will ask pt to followup with optho   3. Mild memory impairment . Expect minimal additional improvement  4.  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

## 2013-07-29 ENCOUNTER — Encounter: Payer: 59 | Attending: Physical Medicine & Rehabilitation

## 2013-07-29 ENCOUNTER — Encounter: Payer: Self-pay | Admitting: Physical Medicine & Rehabilitation

## 2013-07-29 ENCOUNTER — Ambulatory Visit (HOSPITAL_BASED_OUTPATIENT_CLINIC_OR_DEPARTMENT_OTHER): Payer: 59 | Admitting: Physical Medicine & Rehabilitation

## 2013-07-29 VITALS — BP 136/82 | HR 58 | Resp 14 | Ht 71.0 in | Wt 284.8 lb

## 2013-07-29 DIAGNOSIS — H539 Unspecified visual disturbance: Secondary | ICD-10-CM | POA: Diagnosis not present

## 2013-07-29 DIAGNOSIS — I69959 Hemiplegia and hemiparesis following unspecified cerebrovascular disease affecting unspecified side: Secondary | ICD-10-CM | POA: Diagnosis present

## 2013-07-29 DIAGNOSIS — R413 Other amnesia: Secondary | ICD-10-CM | POA: Diagnosis not present

## 2013-07-29 DIAGNOSIS — R279 Unspecified lack of coordination: Secondary | ICD-10-CM | POA: Insufficient documentation

## 2013-07-29 DIAGNOSIS — F329 Major depressive disorder, single episode, unspecified: Secondary | ICD-10-CM

## 2013-07-29 DIAGNOSIS — I69998 Other sequelae following unspecified cerebrovascular disease: Secondary | ICD-10-CM | POA: Insufficient documentation

## 2013-07-29 DIAGNOSIS — F3289 Other specified depressive episodes: Secondary | ICD-10-CM | POA: Insufficient documentation

## 2013-07-29 DIAGNOSIS — R482 Apraxia: Secondary | ICD-10-CM

## 2013-07-29 DIAGNOSIS — I69993 Ataxia following unspecified cerebrovascular disease: Secondary | ICD-10-CM | POA: Diagnosis not present

## 2013-07-29 DIAGNOSIS — E785 Hyperlipidemia, unspecified: Secondary | ICD-10-CM | POA: Insufficient documentation

## 2013-07-29 DIAGNOSIS — I6350 Cerebral infarction due to unspecified occlusion or stenosis of unspecified cerebral artery: Secondary | ICD-10-CM

## 2013-07-29 DIAGNOSIS — F411 Generalized anxiety disorder: Secondary | ICD-10-CM | POA: Diagnosis not present

## 2013-07-29 DIAGNOSIS — IMO0002 Reserved for concepts with insufficient information to code with codable children: Secondary | ICD-10-CM

## 2013-07-29 HISTORY — DX: Apraxia: R48.2

## 2013-07-29 NOTE — Progress Notes (Signed)
   Subjective:    Patient ID: Ricardo Murphy, male    DOB: 20-Jan-1956, 57 y.o.   MRN: [id number]  HPI Patient complains of tremor, anxiety, nervousness as well as decreased balance.  Has appointment with neurologist in a week or 2  No falls. Pursuing long-term disability Pain Inventory Average Pain 0 Pain Right Now 0 My pain is no pain  In the last 24 hours, has pain interfered with the following? General activity 0 Relation with others 0 Enjoyment of life 0 What TIME of day is your pain at its worst? no pain Sleep (in general) Good  Pain is worse with: no pain Pain improves with: no pain Relief from Meds: no pain meds  Mobility walk with assistance use a cane how many minutes can you walk? 15 ability to climb steps?  no do you drive?  no  Function disabled: date disabled na  Neuro/Psych trouble walking confusion anxiety  Prior Studies Any changes since last visit?  no  Physicians involved in your care Any changes since last visit?  no   Family History  Problem Relation Age of Onset  . Cancer - Other Mother   . Diabetes Mother   . Valvular heart disease Mother   . CVA Father    History   Social History  . Marital Status: Married    Spouse Name: N/A    Number of Children: N/A  . Years of Education: N/A   Social History Main Topics  . Smoking status: Never Smoker   . Smokeless tobacco: None  . Alcohol Use: No  . Drug Use: No  . Sexual Activity:    Other Topics Concern  . None   Social History Narrative  . None   Past Surgical History  Procedure Laterality Date  . Appendectomy    . Knee arthroscopy      left  . Shoulder arthroscopy with biceps tendon repair      right   Past Medical History  Diagnosis Date  . Multiple allergies   . Dyslipidemia   . Sinus infection     a couple of months ago  . Pneumonia     4 weeks ago   BP 136/82  Pulse 58  Resp 14  Ht 5\' 11"  (1.803 m)  Wt 284 lb 12.8 oz (129.184 kg)  BMI 39.74 kg/m2   SpO2 94%     Review of Systems  Musculoskeletal: Positive for gait problem.  Psychiatric/Behavioral: Positive for confusion and dysphoric mood.  All other systems reviewed and are negative.       Objective:   Physical Exam Left temporal visual fields cut with confrontation testing. Show no evidence of nystagmus Her strength is 5/5 bilateral deltoid, bicep, tricep and grip Resting tremor head and hands. Sensation intact to light touch in bilateral upper limits. Ambulates with a cane. Evidence of apraxia in the right upper and right lower limb.       Assessment & Plan:  1. Left frontal parietal infarct with right Hemiparesis resolved , still with ataxia, apraxia and balance disorder  2. Visual complaints Left field cut, not explained by his R frontal CVA will ask pt to followup with optho  3. Mild memory impairment . Expect minimal additional improvement  4. Post stroke anxiety, depression and emotional lability- resume Zoloft 50mg  QD  5. Tremor followup with neurologist RTC 6 mo   Disability papers completed, feel pt is permanently unable to resume work

## 2013-07-29 NOTE — Patient Instructions (Signed)
Please followup with neurologist. Ask neurologist to send me a copy of that note  Please followup with eye Dr.   See me in about 6 months   Continue Zoloft until that time

## 2013-08-01 ENCOUNTER — Telehealth: Payer: Self-pay

## 2013-08-01 NOTE — Telephone Encounter (Signed)
Patient's wife was calling back to remind you about writing the letter for Patient's Long Term Disability for the [employer]. Patient has appt to see the attorney on Friday. Patient is requesting  the letter before then and fax it to [employer] [phone] before then.

## 2013-08-02 ENCOUNTER — Encounter: Payer: Self-pay | Admitting: Physical Medicine & Rehabilitation

## 2013-08-02 NOTE — Telephone Encounter (Signed)
Letter for Attorney faxed to [employer] at [phone]. Patient is aware.

## 2013-09-04 ENCOUNTER — Encounter (HOSPITAL_COMMUNITY): Payer: Self-pay | Admitting: Emergency Medicine

## 2013-09-04 ENCOUNTER — Inpatient Hospital Stay (HOSPITAL_COMMUNITY)
Admission: EM | Admit: 2013-09-04 | Discharge: 2013-09-06 | DRG: 069 | Disposition: A | Discharge status: Home / Self Care | Payer: BC Managed Care – PPO | Attending: Neurology | Admitting: Neurology

## 2013-09-04 ENCOUNTER — Emergency Department (HOSPITAL_COMMUNITY): Payer: BC Managed Care – PPO

## 2013-09-04 ENCOUNTER — Inpatient Hospital Stay (HOSPITAL_COMMUNITY): Payer: BC Managed Care – PPO

## 2013-09-04 DIAGNOSIS — Z823 Family history of stroke: Secondary | ICD-10-CM

## 2013-09-04 DIAGNOSIS — R471 Dysarthria and anarthria: Secondary | ICD-10-CM | POA: Diagnosis present

## 2013-09-04 DIAGNOSIS — R279 Unspecified lack of coordination: Secondary | ICD-10-CM | POA: Diagnosis present

## 2013-09-04 DIAGNOSIS — R259 Unspecified abnormal involuntary movements: Secondary | ICD-10-CM | POA: Diagnosis present

## 2013-09-04 DIAGNOSIS — E785 Hyperlipidemia, unspecified: Secondary | ICD-10-CM | POA: Diagnosis present

## 2013-09-04 DIAGNOSIS — I498 Other specified cardiac arrhythmias: Secondary | ICD-10-CM | POA: Diagnosis present

## 2013-09-04 DIAGNOSIS — G458 Other transient cerebral ischemic attacks and related syndromes: Principal | ICD-10-CM | POA: Diagnosis present

## 2013-09-04 DIAGNOSIS — I69959 Hemiplegia and hemiparesis following unspecified cerebrovascular disease affecting unspecified side: Secondary | ICD-10-CM

## 2013-09-04 DIAGNOSIS — Z833 Family history of diabetes mellitus: Secondary | ICD-10-CM

## 2013-09-04 DIAGNOSIS — F411 Generalized anxiety disorder: Secondary | ICD-10-CM | POA: Diagnosis present

## 2013-09-04 DIAGNOSIS — F329 Major depressive disorder, single episode, unspecified: Secondary | ICD-10-CM | POA: Diagnosis present

## 2013-09-04 DIAGNOSIS — Z9089 Acquired absence of other organs: Secondary | ICD-10-CM

## 2013-09-04 DIAGNOSIS — R482 Apraxia: Secondary | ICD-10-CM

## 2013-09-04 DIAGNOSIS — I6350 Cerebral infarction due to unspecified occlusion or stenosis of unspecified cerebral artery: Secondary | ICD-10-CM

## 2013-09-04 DIAGNOSIS — I639 Cerebral infarction, unspecified: Secondary | ICD-10-CM | POA: Diagnosis present

## 2013-09-04 DIAGNOSIS — F3289 Other specified depressive episodes: Secondary | ICD-10-CM | POA: Diagnosis present

## 2013-09-04 HISTORY — DX: Cerebral infarction, unspecified: I63.9

## 2013-09-04 LAB — COMPREHENSIVE METABOLIC PANEL
ALK PHOS: 58 U/L (ref 39–117)
ALT: 17 U/L (ref 0–53)
AST: 16 U/L (ref 0–37)
Albumin: 3.7 g/dL (ref 3.5–5.2)
BUN: 14 mg/dL (ref 6–23)
CALCIUM: 9.1 mg/dL (ref 8.4–10.5)
CO2: 26 mEq/L (ref 19–32)
Chloride: 101 mEq/L (ref 96–112)
Creatinine, Ser: 1.27 mg/dL (ref 0.50–1.35)
GFR calc Af Amer: 71 mL/min — ABNORMAL LOW (ref >90)
GFR, EST NON AFRICAN AMERICAN: 61 mL/min — AB (ref >90)
Glucose, Bld: 92 mg/dL (ref 70–99)
Potassium: 4.2 mEq/L (ref 3.7–5.3)
SODIUM: 138 meq/L (ref 137–147)
Total Bilirubin: 0.2 mg/dL — ABNORMAL LOW (ref 0.3–1.2)
Total Protein: 7.3 g/dL (ref 6.0–8.3)

## 2013-09-04 LAB — DIFFERENTIAL
BASOS ABS: 0.1 10*3/uL (ref 0.0–0.1)
Basophils Relative: 1 % (ref 0–1)
EOS PCT: 3 % (ref 0–5)
Eosinophils Absolute: 0.2 10*3/uL (ref 0.0–0.7)
LYMPHS ABS: 2.7 10*3/uL (ref 0.7–4.0)
LYMPHS PCT: 33 % (ref 12–46)
Monocytes Absolute: 0.8 10*3/uL (ref 0.1–1.0)
Monocytes Relative: 10 % (ref 3–12)
NEUTROS ABS: 4.4 10*3/uL (ref 1.7–7.7)
NEUTROS PCT: 54 % (ref 43–77)

## 2013-09-04 LAB — CBC
HCT: 44 % (ref 39.0–52.0)
Hemoglobin: 15.4 g/dL (ref 13.0–17.0)
MCH: 30.4 pg (ref 26.0–34.0)
MCHC: 35 g/dL (ref 30.0–36.0)
MCV: 87 fL (ref 78.0–100.0)
PLATELETS: 258 10*3/uL (ref 150–400)
RBC: 5.06 MIL/uL (ref 4.22–5.81)
RDW: 12.9 % (ref 11.5–15.5)
WBC: 8.1 10*3/uL (ref 4.0–10.5)

## 2013-09-04 LAB — POCT I-STAT TROPONIN I: Troponin i, poc: 0.01 ng/mL (ref 0.00–0.08)

## 2013-09-04 LAB — PROTIME-INR
INR: 0.91 (ref 0.00–1.49)
Prothrombin Time: 12.1 seconds (ref 11.6–15.2)

## 2013-09-04 LAB — TROPONIN I: Troponin I: <0.3 ng/mL (ref <0.30)

## 2013-09-04 LAB — ETHANOL

## 2013-09-04 LAB — APTT: APTT: 27 s (ref 24–37)

## 2013-09-04 MED ORDER — LABETALOL HCL 5 MG/ML IV SOLN: 10.0000 mg | INTRAVENOUS | Status: DC | PRN

## 2013-09-04 MED ORDER — ACETAMINOPHEN 650 MG RE SUPP: 650.0000 mg | RECTAL | Status: DC | PRN

## 2013-09-04 MED ORDER — ALTEPLASE (STROKE) FULL DOSE INFUSION
90.0000 mg | Freq: Once | INTRAVENOUS | Status: AC
  Administered 2013-09-04: 90 mg via INTRAVENOUS
  Filled 2013-09-04: qty 90

## 2013-09-04 MED ORDER — PANTOPRAZOLE SODIUM 40 MG IV SOLR
40.0000 mg | Freq: Every day | INTRAVENOUS | Status: DC
  Administered 2013-09-05: 40 mg via INTRAVENOUS
  Filled 2013-09-04 (×2): qty 40

## 2013-09-04 MED ORDER — SODIUM CHLORIDE 0.9 % IV SOLN
INTRAVENOUS | Status: DC
  Administered 2013-09-04 – 2013-09-05 (×2): via INTRAVENOUS

## 2013-09-04 MED ORDER — METOCLOPRAMIDE HCL 5 MG/ML IJ SOLN
10.0000 mg | Freq: Once | INTRAMUSCULAR | Status: AC
  Administered 2013-09-05: 10 mg via INTRAVENOUS
  Filled 2013-09-04: qty 2

## 2013-09-04 MED ORDER — ACETAMINOPHEN 325 MG PO TABS
650.0000 mg | ORAL_TABLET | ORAL | Status: DC | PRN
  Administered 2013-09-06: 650 mg via ORAL
  Filled 2013-09-04: qty 2

## 2013-09-04 NOTE — ED Notes (Addendum)
Per EMS, pt began having left sided head ache, slurred speech, and "thick tongue." Pt has hx of stroke in 2013. 20g in left hand. Pt alert x4,

## 2013-09-04 NOTE — ED Provider Notes (Addendum)
CSN: [id number]     Arrival date & time    History   First MD Initiated Contact with Patient 09/04/13 2211     Chief Complaint  Patient presents with  . Code Stroke   (Consider location/radiation/quality/duration/timing/severity/associated sxs/prior Treatment) The history is provided by the patient and the spouse. The history is limited by the condition of the patient.   Patient here after having an acute onset of left-sided weakness along with trouble speaking approximately 1 hour prior to arrival. History of CVA before in the past. EMS called and patient transported here. When patient arrived, he was met by the code stroke physician and had a head CT that was negative. He is within the TPA window and therapeutic risk/benefits explained to patient and according to the neurologist they will proceed Past Medical History  Diagnosis Date  . Multiple allergies   . Dyslipidemia   . Sinus infection     a couple of months ago  . Pneumonia     4 weeks ago   Past Surgical History  Procedure Laterality Date  . Appendectomy    . Knee arthroscopy      left  . Shoulder arthroscopy with biceps tendon repair      right   Family History  Problem Relation Age of Onset  . Cancer - Other Mother   . Diabetes Mother   . Valvular heart disease Mother   . CVA Father    History  Substance Use Topics  . Smoking status: Never Smoker   . Smokeless tobacco: Not on file  . Alcohol Use: No    Review of Systems  Unable to perform ROS   Allergies  Statins; Atorvastatin; and Zocor  Home Medications   Current Outpatient Rx  Name  Route  Sig  Dispense  Refill  . ALPRAZolam (XANAX) 0.5 MG tablet   Oral   Take 1 tablet (0.5 mg total) by mouth at bedtime as needed for sleep.   15 tablet   0     Also may be used for panic attack   . aspirin EC 325 MG tablet   Oral   Take 325 mg by mouth daily.         . colchicine-probenecid 0.5-500 MG per tablet               . lisinopril  (PRINIVIL,ZESTRIL) 20 MG tablet               . sertraline (ZOLOFT) 50 MG tablet   Oral   Take 1 tablet (50 mg total) by mouth daily.   30 tablet   5    Wt 280 lb (127.007 kg) Physical Exam  Nursing note and vitals reviewed. Constitutional: He is oriented to person, place, and time. He appears well-developed and well-nourished.  Non-toxic appearance. No distress.  HENT:  Head: Normocephalic and atraumatic.  Eyes: Conjunctivae, EOM and lids are normal. Pupils are equal, round, and reactive to light.  Neck: Normal range of motion. Neck supple. No tracheal deviation present. No mass present.  Cardiovascular: Normal rate, regular rhythm and normal heart sounds.  Exam reveals no gallop.   No murmur heard. Pulmonary/Chest: Effort normal and breath sounds normal. No stridor. No respiratory distress. He has no decreased breath sounds. He has no wheezes. He has no rhonchi. He has no rales.  Abdominal: Soft. Normal appearance and bowel sounds are normal. He exhibits no distension. There is no tenderness. There is no rebound and no CVA tenderness.    Musculoskeletal: Normal range of motion. He exhibits no edema and no tenderness.  Neurological: He is alert and oriented to person, place, and time. He displays tremor. No cranial nerve deficit or sensory deficit. Coordination abnormal. GCS eye subscore is 4. GCS verbal subscore is 5. GCS motor subscore is 6.  Baseline right sided weakness, left side 5/5  lue dysmetria, dysarthria  Skin: Skin is warm and dry. No abrasion and no rash noted.  Psychiatric: His affect is blunt. His speech is delayed.    ED Course  Procedures (including critical care time) Labs Review Labs Reviewed  ETHANOL  PROTIME-INR  APTT  CBC  DIFFERENTIAL  COMPREHENSIVE METABOLIC PANEL  TROPONIN I  URINE RAPID DRUG SCREEN (HOSP PERFORMED)  URINALYSIS, ROUTINE W REFLEX MICROSCOPIC   Imaging Review No results found.  EKG Interpretation    Date/Time:  Sunday September 04 2013 22:17:57 EST Ventricular Rate:  55 PR Interval:  170 QRS Duration: 114 QT Interval:  405 QTC Calculation: 387 R Axis:   44 Text Interpretation:  Sinus rhythm Incomplete right bundle branch block Abnormal inferior Q waves Baseline wander in lead(s) II III aVF Confirmed by ALLEN  MD, ANTHONY ([id number]) on 09/04/2013 10:23:46 PM            MDM  No diagnosis found. Patient to be started on TPA per the stroke service and admitted to the ICU    Anthony T Allen, MD 09/04/13 2218  Anthony T Allen, MD 09/04/13 2224 

## 2013-09-04 NOTE — H&P (Signed)
Neurology Consultation Reason for Consult: Dysarthria Referring Physician: Zenia Resides, A  CC: Dysarthria  History is obtained from:Patient, wife  HPI: Ricardo Murphy is a 58 y.o. male who was in his normal state after a previous stroke in 2013 until 9pm tonight. At that time, he had sudden onset severe dysarthria and she was concerned for repeat stroke and therefore called 911. On arrival, he was noted to have dysarthria as well as left-sided dysmetria that was significant and therefore TPA was offered. After discussing with him and his wife, the decision was made to proceed with TPA.  Of note, he has had some abnormal movements of his right arm since his last stroke. His wife states that these movements get significantly worse whenever he is anxious, and the movements are occurring at this time.   LKW: 9 PM tpa given?: Yes NIHSS: 7  ROS: A 14 point ROS was performed and is negative except as noted in the HPI.  Past Medical History  Diagnosis Date  . Multiple allergies   . Dyslipidemia   . Sinus infection     a couple of months ago  . Pneumonia     4 weeks ago    Family History: Mother-diabetes Father-stroke  Social History: Tob: Never smoker  Exam: Current vital signs: BP 136/80  Pulse 57  Temp(Src) 98.8 F (37.1 C) (Oral)  Resp 21  Wt 127.007 kg (280 lb)  SpO2 100% Vital signs in last 24 hours: Temp:  [98.8 F (37.1 C)] 98.8 F (37.1 C) (01/11 2216) Pulse Rate:  [57] 57 (01/11 2215) Resp:  [21] 21 (01/11 2215) BP: (136)/(80) 136/80 mmHg (01/11 2215) SpO2:  [100 %] 100 % (01/11 2215) Weight:  [127.007 kg (280 lb)] 127.007 kg (280 lb) (01/11 2211)  General: In bed, appears anxious Resp: non-laboured breathing Abd: NT, ND CV: Regular rate and rhythm Ext: warm Mental Status: Patient is awake, alert, oriented to person, place, month, year, and situation. Immediate and remote memory are intact. No signs of aphasia or neglect He has a severe dysarthria that does  limit some history. Cranial Nerves: II: Visual Fields are full. Pupils are equal, round, and reactive to light.   III,IV, VI: EOMI without ptosis or diploplia.  V: Facial sensation is symmetric to temperature VII: Facial movement is symmetric.  VIII: hearing is intact to voice X: Uvula elevates symmetrically XI: Shoulder shrug is symmetric. XII: tongue is midline without atrophy or fasciculations.  Motor: Tone is normal. Bulk is normal. 5/5 strength was present on left, he has 4 minus/5 strength of the right arm and leg. He has abnormal movements of the right arm which are suppressible. Sensory: Sensation is symmetric to light touch and temperature in the arms and legs. Cerebellar: FNF and HKS are consistent with weakness on the right, on the left he has marketed dysmetria of both the arm and leg I have reviewed labs in epic and the results pertinent to this consultation are: CBC-unremarkable  I have reviewed the images obtained: CT head - no acute findings.   Impression: 58 year old male with sudden onset dysarthria and left-sided ataxia and 9 PM tonight. He arrived within the time frame for t-PA and therefore this was administered. He will be admitted to the ICU for close monitoring overnight.   Recommendations: 1. HgbA1c, fasting lipid panel 2. MRI, MRA  of the brain without contrast 3. Frequent neuro checks 4. Echocardiogram 5. Carotid dopplers 6. Prophylactic therapy-None 7. Risk factor modification 8. Telemetry monitoring 9. PT  consult, OT consult, Speech consult   This patient is critically ill and at significant risk of neurological worsening, death and care requires constant monitoring of vital signs, hemodynamics,respiratory and cardiac monitoring, neurological assessment, discussion with family, other specialists and medical decision making of high complexity. I spent 60 minutes of neurocritical care time  in the care of  this patient.  Roland Rack, MD [facility] [phone]  If 7pm- 7am, please page neurology on call at [phone].  09/04/2013  11:47 PM

## 2013-09-04 NOTE — ED Notes (Signed)
Pt refused foley catheter for tPA; Dr.Kilpatrick aware.

## 2013-09-04 NOTE — ED Notes (Signed)
Dr. Kirkpatrick at the bedside.  

## 2013-09-04 NOTE — ED Notes (Signed)
TPA delivered to bedside by pharmacy.

## 2013-09-04 NOTE — ED Notes (Signed)
Pts airway cleared by Dr. Coralyn Pear before going to CT.

## 2013-09-05 ENCOUNTER — Encounter (HOSPITAL_COMMUNITY): Payer: Self-pay | Admitting: *Deleted

## 2013-09-05 ENCOUNTER — Inpatient Hospital Stay (HOSPITAL_COMMUNITY): Payer: BC Managed Care – PPO

## 2013-09-05 DIAGNOSIS — I517 Cardiomegaly: Secondary | ICD-10-CM

## 2013-09-05 LAB — URINALYSIS, ROUTINE W REFLEX MICROSCOPIC
BILIRUBIN URINE: NEGATIVE
Glucose, UA: NEGATIVE mg/dL
Hgb urine dipstick: NEGATIVE
KETONES UR: NEGATIVE mg/dL
Leukocytes, UA: NEGATIVE
NITRITE: NEGATIVE
Protein, ur: NEGATIVE mg/dL
SPECIFIC GRAVITY, URINE: 1.021 (ref 1.005–1.030)
Urobilinogen, UA: 0.2 mg/dL (ref 0.0–1.0)
pH: 6 (ref 5.0–8.0)

## 2013-09-05 LAB — GLUCOSE, CAPILLARY: Glucose-Capillary: 81 mg/dL (ref 70–99)

## 2013-09-05 LAB — LIPID PANEL
CHOL/HDL RATIO: 5.7 ratio
CHOLESTEROL: 264 mg/dL — AB (ref 0–200)
HDL: 46 mg/dL (ref >39)
LDL Cholesterol: 189 mg/dL — ABNORMAL HIGH (ref 0–99)
Triglycerides: 144 mg/dL (ref <150)
VLDL: 29 mg/dL (ref 0–40)

## 2013-09-05 LAB — POCT I-STAT, CHEM 8
BUN: 14 mg/dL (ref 6–23)
CALCIUM ION: 1.18 mmol/L (ref 1.12–1.23)
Chloride: 102 mEq/L (ref 96–112)
Creatinine, Ser: 1.4 mg/dL — ABNORMAL HIGH (ref 0.50–1.35)
Glucose, Bld: 94 mg/dL (ref 70–99)
HCT: 46 % (ref 39.0–52.0)
HEMOGLOBIN: 15.6 g/dL (ref 13.0–17.0)
Potassium: 4.2 mEq/L (ref 3.7–5.3)
SODIUM: 141 meq/L (ref 137–147)
TCO2: 26 mmol/L (ref 0–100)

## 2013-09-05 LAB — RAPID URINE DRUG SCREEN, HOSP PERFORMED
Amphetamines: NOT DETECTED
BARBITURATES: NOT DETECTED
BENZODIAZEPINES: NOT DETECTED
COCAINE: NOT DETECTED
Opiates: NOT DETECTED
Tetrahydrocannabinol: NOT DETECTED

## 2013-09-05 LAB — MRSA PCR SCREENING: MRSA BY PCR: NEGATIVE

## 2013-09-05 LAB — HEMOGLOBIN A1C
Hgb A1c MFr Bld: 5.8 % — ABNORMAL HIGH (ref <5.7)
Mean Plasma Glucose: 120 mg/dL — ABNORMAL HIGH (ref <117)

## 2013-09-05 MED ORDER — LISINOPRIL 20 MG PO TABS
20.0000 mg | ORAL_TABLET | Freq: Every day | ORAL | Status: DC
  Administered 2013-09-05 – 2013-09-06 (×2): 20 mg via ORAL
  Filled 2013-09-05 (×2): qty 1

## 2013-09-05 MED ORDER — SERTRALINE HCL 50 MG PO TABS
50.0000 mg | ORAL_TABLET | Freq: Every day | ORAL | Status: DC
  Administered 2013-09-05: 50 mg via ORAL
  Filled 2013-09-05 (×2): qty 1

## 2013-09-05 MED ORDER — PERFLUTREN LIPID MICROSPHERE
2.0000 mL | INTRAVENOUS | Status: AC | PRN
  Administered 2013-09-05: 2 mL via INTRAVENOUS
  Filled 2013-09-05: qty 10

## 2013-09-05 MED ORDER — CLOPIDOGREL BISULFATE 75 MG PO TABS
75.0000 mg | ORAL_TABLET | Freq: Every day | ORAL | Status: DC
  Administered 2013-09-05: 75 mg via ORAL
  Filled 2013-09-05 (×3): qty 1

## 2013-09-05 MED ORDER — IOHEXOL 350 MG/ML SOLN
80.0000 mL | Freq: Once | INTRAVENOUS | Status: AC | PRN
  Administered 2013-09-05: 80 mL via INTRAVENOUS

## 2013-09-05 NOTE — Progress Notes (Signed)
UR completed.  Myshawn Chiriboga, RN BSN M[facility] CCM Trauma/Neuro ICU Case Manager [phone]  

## 2013-09-05 NOTE — Evaluation (Signed)
Clinical/Bedside Swallow Evaluation Patient Details  Name: Ricardo Murphy MRN: [id number] Date of Birth: October 20, 1955  Today's Date: 09/05/2013 Time: 0973-5329 SLP Time Calculation (min): 10 min  Past Medical History:  Past Medical History  Diagnosis Date  . Multiple allergies   . Dyslipidemia   . Sinus infection     a couple of months ago  . Pneumonia     4 weeks ago  . Stroke     Dec. 9, 2013   Past Surgical History:  Past Surgical History  Procedure Laterality Date  . Appendectomy    . Knee arthroscopy      left  . Shoulder arthroscopy with biceps tendon repair      right   HPI:  Ricardo Murphy is a 58 y.o. male Hx Left frontal parietal infarct with right Hemiparesis resolved , still with ataxia, apraxia and balance disorder 08/06/2012, presenting with dysarthria and left sided dysmetria. Status post IV t-PA 09/04/2013 at 2220. Imaging pending. Suspect a right brain stroke. Etiology undetermined at this time. CXR negative.    Assessment / Plan / Recommendation Clinical Impression  Patient presents with a functional oropharyngeal swallow. No overt indication of aspiration noted. Recommend a regular diet, thin liquid with general safe swallowing precautions. No SLP f/u for swallow indicated at this time.     Aspiration Risk  Mild    Diet Recommendation Regular;Thin liquid   Liquid Administration via: Cup;Straw Medication Administration: Whole meds with liquid Supervision: Patient able to self feed Compensations: Slow rate;Small sips/bites Postural Changes and/or Swallow Maneuvers: Seated upright 90 degrees    Other  Recommendations Oral Care Recommendations: Oral care BID   Follow Up Recommendations  None    Frequency and Duration        Pertinent Vitals/Pain n/a        Swallow Study    General HPI: Ricardo Murphy is a 58 y.o. male Hx Left frontal parietal infarct with right Hemiparesis resolved , still with ataxia, apraxia and balance disorder  08/06/2012, presenting with dysarthria and left sided dysmetria. Status post IV t-PA 09/04/2013 at 2220. Imaging pending. Suspect a right brain stroke. Etiology undetermined at this time. CXR negative.  Type of Study: Bedside swallow evaluation Previous Swallow Assessment: none Diet Prior to this Study: NPO Temperature Spikes Noted: No Respiratory Status: Room air History of Recent Intubation: No Behavior/Cognition: Alert;Cooperative;Pleasant mood Oral Cavity - Dentition: Adequate natural dentition Self-Feeding Abilities: Able to feed self Patient Positioning: Upright in chair Baseline Vocal Quality: Clear Volitional Cough: Strong Volitional Swallow: Able to elicit    Oral/Motor/Sensory Function Overall Oral Motor/Sensory Function: Appears within functional limits for tasks assessed   Ice Chips Ice chips: Not tested   Thin Liquid Thin Liquid: Within functional limits Presentation: Cup;Self Fed;Straw    Nectar Thick Nectar Thick Liquid: Not tested   Honey Thick Honey Thick Liquid: Not tested   Puree Puree: Within functional limits Presentation: Self Fed;Spoon   Solid   GO   Ricardo Wayment MA, CCC-SLP [phone]  Solid: Within functional limits Presentation: Self Fed       Ricardo Murphy 09/05/2013,3:55 PM

## 2013-09-05 NOTE — Progress Notes (Signed)
PT Cancellation Note  Patient Details Name: Ricardo Murphy MRN: [id number] DOB: April 01, 1956   Cancelled Treatment:    Reason Eval/Treat Not Completed: Medical issues which prohibited therapy;Other (comment) (pt on strict bedrest for TPA for 24 hours)   Gustavus Bryant, [place] [phone] 09/05/2013, 7:50 AM

## 2013-09-05 NOTE — Progress Notes (Signed)
Stroke Team Progress Note  HISTORY Ricardo Murphy is a 58 y.o. male who was in his normal state after a previous stroke in 2013 until 9pm tonight 09/04/2013. At that time, he had sudden onset severe dysarthria and she was concerned for repeat stroke and therefore called 911. On arrival, he was noted to have dysarthria as well as left-sided dysmetria that was significant and therefore TPA was offered. After discussing with him and his wife, the decision was made to proceed with TPA. Of note, he has had some abnormal movements of his right arm since his last stroke. His wife states that these movements get significantly worse whenever he is anxious, and the movements are occurring at this time.  He was admitted to the neuro ICU for further evaluation and treatment.  SUBJECTIVE His wife is at the bedside.  Overall he feels his condition is gradually improving. She feels he is definitely better today.BP has been well controlled.  OBJECTIVE Most recent Vital Signs: Filed Vitals:   09/05/13 0600 09/05/13 0630 09/05/13 0700 09/05/13 0800  BP: 111/73 103/55 88/71 116/61  Pulse: 53  46 54  Temp:   98.4 F (36.9 C)   TempSrc:   Oral   Resp: 16 11 19 11   Height:      Weight:      SpO2: 99% 95% 98% 97%   CBG (last 3)  No results found for this basename: GLUCAP,  in the last 72 hours  IV Fluid Intake:   . sodium chloride 50 mL/hr at 09/04/13 2252    MEDICATIONS  . pantoprazole (PROTONIX) IV  40 mg Intravenous QHS   PRN:  acetaminophen, acetaminophen, labetalol  Diet:  NPO  Activity:  Bedrest DVT Prophylaxis:  SCDs  CLINICALLY SIGNIFICANT STUDIES Basic Metabolic Panel:   Recent Labs Lab 09/04/13 2214  NA 138  K 4.2  CL 101  CO2 26  GLUCOSE 92  BUN 14  CREATININE 1.27  CALCIUM 9.1   Liver Function Tests:   Recent Labs Lab 09/04/13 2214  AST 16  ALT 17  ALKPHOS 58  BILITOT 0.2*  PROT 7.3  ALBUMIN 3.7   CBC:   Recent Labs Lab 09/04/13 2214  WBC 8.1  NEUTROABS 4.4   HGB 15.4  HCT 44.0  MCV 87.0  PLT 258   Coagulation:   Recent Labs Lab 09/04/13 2214  LABPROT 12.1  INR 0.91   Cardiac Enzymes:   Recent Labs Lab 09/04/13 2214  TROPONINI <0.30   Urinalysis:   Recent Labs Lab 09/05/13 0600  COLORURINE YELLOW  LABSPEC 1.021  PHURINE 6.0  GLUCOSEU NEGATIVE  HGBUR NEGATIVE  BILIRUBINUR NEGATIVE  KETONESUR NEGATIVE  PROTEINUR NEGATIVE  UROBILINOGEN 0.2  NITRITE NEGATIVE  LEUKOCYTESUR NEGATIVE   Lipid Panel    Component Value Date/Time   CHOL 264* 09/05/2013 0225   TRIG 144 09/05/2013 0225   HDL 46 09/05/2013 0225   CHOLHDL 5.7 09/05/2013 0225   VLDL 29 09/05/2013 0225   LDLCALC 189* 09/05/2013 0225   HgbA1C  Lab Results  Component Value Date   HGBA1C 5.5 08/05/2012    Urine Drug Screen:     Component Value Date/Time   LABOPIA NONE DETECTED 09/05/2013 0600   COCAINSCRNUR NONE DETECTED 09/05/2013 0600   LABBENZ NONE DETECTED 09/05/2013 0600   AMPHETMU NONE DETECTED 09/05/2013 0600   THCU NONE DETECTED 09/05/2013 0600   LABBARB NONE DETECTED 09/05/2013 0600    Alcohol Level:   Recent Labs Lab 09/04/13 2214  ETH <11  CT of the brain  09/04/2013   No acute intracranial abnormality. Previously identified white matter strokes on MRI are not visible on CT.    CT Angio brain   MRI/A of the brain  Pt cannot tolerated closed MRI 08/27/2012 done in [place], no acute abnormality  2D Echocardiogram    Carotid Doppler    CXR  09/04/2013   Suboptimal inspiration. No acute cardiopulmonary disease. Stable cardiomegaly.     EKG  normal sinus rhythm.   Therapy Recommendations   Physical Exam   Middle aged caucasian male not in distress.Awake alert. Afebrile. Head is nontraumatic. Neck is supple without bruit. Hearing is normal. Cardiac exam no murmur or gallop. Lungs are clear to auscultation. Distal pulses are well felt. Neurological Exam : awake alert oriented x 3. Speech clear no dysarthria or aphasia.eye movements are full. No  nystagmus. Fundi not visualized. Face symmetric. Tongue midline. Moves all 4 limbs well against gravity but poor and variable efffort on right. Mild RLE weakness and increased tone. Plantars dpwngoing. Sensation intact.No finger to nose or knee to heel dysmetria. Gait not tested. ASSESSMENT Mr. Ricardo Murphy is a 58 y.o. male presenting with dysarthria and left sided dysmetria. Status post IV t-PA 09/04/2013 at 2220. Imaging pending. Suspect a right brain stroke. Etiology undetermined at this time. Prior stroke due to small vessel disease.  On aspirin 81 mg orally every day prior to admission. Now on no antithrombotics as within 24h of tpa for secondary stroke prevention. Patient with resultant dysarthria (improved), right leg weakness (old, subjective).. Work up underway.  Hyperlipidemia, LDL 189, intolerant to statins and zetia, so not taking, goal LDL < 100  Sinus bradycardia, asx Likely obstructive sleep apnea, needs OP eval  Per Dr. Letta Pate:  Hx Left frontal parietal infarct with right Hemiparesis resolved , still with ataxia, apraxia and balance disorder 08/06/2012\  Visual complaints Left field cut, not explained by his R frontal CVA will ask pt to followup with optho   Mild memory impairment . Expect minimal additional improvement   Post stroke anxiety, depression and emotional lability- resume Zoloft 50mg  QD   Tremor followup with neurologist  Disability papers completed, feel pt is permanently unable to resume work Family hx stroke (father)  Hospital day # 1  TREATMENT/PLAN  Add clopidogrel 75 mg orally every day for secondary stroke prevention if 24h imaging negative for hemorrhage. First dose after 2220 and prior to midnight tonight   Strcit control of HT per post TPA protocol  CT angio head. cancel MRI and MRA due to pt intolerance.  No statins given severe intolerance  OOB. Therapy evals.  OP eval for obstructive sleep apnea   Burnetta Sabin, MSN, RN, ANVP-BC,  ANP-BC, [facility] Pager: [phone] 09/05/2013 8:46 AM This patient is critically ill and at significant risk of neurological worsening, death and care requires constant monitoring of vital signs, hemodynamics,respiratory and cardiac monitoring,review of multiple databases, neurological assessment, discussion with family, other specialists and medical decision making of high complexity. I spent 30 minutes of neurocritical care time  in the care of  this patient. I have personally obtained a history, examined the patient, evaluated imaging results, and formulated the assessment and plan of care. I agree with the above. Antony Contras, MD

## 2013-09-05 NOTE — Evaluation (Signed)
Speech Language Pathology Evaluation Patient Details Name: Ricardo Murphy MRN: [id number] DOB: 07/09/1956 Today's Date: 09/05/2013 Time: 1448-1856 SLP Time Calculation (min): 7 min  Problem List:  Patient Active Problem List   Diagnosis Date Noted  . Stroke 09/04/2013  . Apraxia, late effect of cerebrovascular disease 07/29/2013  . Ataxia, late effect of cerebrovascular disease 06/14/2013  . Depression due to stroke 06/14/2013  . CVA (cerebral infarction) 08/05/2012  . HTN (hypertension) 08/05/2012  . Dyslipidemia 08/05/2012   Past Medical History:  Past Medical History  Diagnosis Date  . Multiple allergies   . Dyslipidemia   . Sinus infection     a couple of months ago  . Pneumonia     4 weeks ago  . Stroke     Dec. 9, 2013   Past Surgical History:  Past Surgical History  Procedure Laterality Date  . Appendectomy    . Knee arthroscopy      left  . Shoulder arthroscopy with biceps tendon repair      right   HPI:  Mr. Ricardo Murphy is a 58 y.o. male Hx Left frontal parietal infarct with right Hemiparesis resolved , still with ataxia, apraxia and balance disorder 08/06/2012, presenting with dysarthria and left sided dysmetria. Status post IV t-PA 09/04/2013 at 2220. Imaging pending. Suspect a right brain stroke. Etiology undetermined at this time. CXR negative.    Assessment / Plan / Recommendation Clinical Impression  Cognitive-linguistic function at baseline and WFL. No further SLP needs indicated at this time. Please reconsult as needed.     SLP Assessment  Patient does not need any further Speech Lanaguage Pathology Services    Follow Up Recommendations  None              SLP Evaluation Prior Functioning  Cognitive/Linguistic Baseline: Within functional limits  Lives With: Spouse   Cognition  Overall Cognitive Status: Within Functional Limits for tasks assessed Orientation Level: Oriented to person;Oriented to place;Oriented to time;Oriented to  situation    Comprehension  Auditory Comprehension Overall Auditory Comprehension: Appears within functional limits for tasks assessed Visual Recognition/Discrimination Discrimination: Within Function Limits Reading Comprehension Reading Status: Not tested    Expression Expression Primary Mode of Expression: Verbal Verbal Expression Overall Verbal Expression: Appears within functional limits for tasks assessed   Oral / Motor Oral Motor/Sensory Function Overall Oral Motor/Sensory Function: Appears within functional limits for tasks assessed Motor Speech Overall Motor Speech: Appears within functional limits for tasks assessed   GO   Gabriel Rainwater MA, CCC-SLP [phone]   Ricardo Murphy 09/05/2013, 3:58 PM

## 2013-09-05 NOTE — Progress Notes (Signed)
Echocardiogram 2D Echocardiogram has been performed.  Ricardo Murphy 09/05/2013, 9:58 AM

## 2013-09-05 NOTE — Progress Notes (Signed)
Wife reports pt was recently on Welchol but had to come off recently d/t uric acid level elevation and joint aches. Pt was then placed on Allopurinol for Gout. Wife reports last dose was approximately 3 weeks ago.

## 2013-09-05 NOTE — Plan of Care (Signed)
Problem: tPA Day Progression Outcomes-Only if tPA administered Goal: Pre tPA foley catheter inserted Outcome: Not Met (add Reason) Pt refused foley, able to use urinal

## 2013-09-05 NOTE — Evaluation (Signed)
Physical Therapy Evaluation Patient Details Name: Ricardo Murphy MRN: [id number] DOB: 20-Apr-1956 Today's Date: 09/05/2013 Time: 7902-4097 PT Time Calculation (min): 20 min  PT Assessment / Plan / Recommendation History of Present Illness  Ricardo Murphy is a 58 y.o. male who was in his normal state after a previous stroke in 2013 until 9pm tonight. At that time, he had sudden onset severe dysarthria and she was concerned for repeat stroke and therefore called 911. On arrival, he was noted to have dysarthria as well as left-sided dysmetria that was significant and therefore TPA was offered. After discussing with him and his wife, the decision was made to proceed with TPA.  Clinical Impression  Pt functioning near baseline however requires use of RW for safe ambulation at this time. Pt safe to d/c home with spouse once medically stable. Pt to benefit from con't outpt PT services.     PT Assessment  Patient needs continued PT services    Follow Up Recommendations  Outpatient PT;Supervision/Assistance - 24 hour    Does the patient have the potential to tolerate intense rehabilitation      Barriers to Discharge        Equipment Recommendations  Rolling walker with 5" wheels    Recommendations for Other Services     Frequency Min 2X/week    Precautions / Restrictions Precautions Precautions: Fall Precaution Comments: residual R sided weakness from previous stroke Restrictions Weight Bearing Restrictions: No   Pertinent Vitals/Pain Denies pain      Mobility  Bed Mobility Overal bed mobility:  (pt up in chair upon arrival) Transfers Overall transfer level: Needs assistance Equipment used: Rolling walker (2 wheeled) Transfers: Sit to/from Stand Sit to Stand: Supervision Ambulation/Gait Ambulation/Gait assistance: Min guard Ambulation Distance (Feet): 125 Feet Assistive device: Rolling walker (2 wheeled) Gait Pattern/deviations: Ataxic Gait velocity: decreasead but at  baseline General Gait Details: pt with R LE ataxia however at baseline. pt compensates well  Modified Rankin (Stroke Patients Only) Pre-Morbid Rankin Score: Moderate disability Modified Rankin: Moderate disability    Exercises     PT Diagnosis: Difficulty walking  PT Problem List: Decreased balance;Decreased mobility;Decreased strength PT Treatment Interventions: Gait training;Balance training     PT Goals(Current goals can be found in the care plan section) Acute Rehab PT Goals Patient Stated Goal: home and to start outpt PT again PT Goal Formulation: With patient Time For Goal Achievement: 09/12/13 Potential to Achieve Goals: Good  Visit Information  Last PT Received On: 09/05/13 Assistance Needed: +1 History of Present Illness: Ricardo Murphy is a 58 y.o. male who was in his normal state after a previous stroke in 2013 until 9pm tonight. At that time, he had sudden onset severe dysarthria and she was concerned for repeat stroke and therefore called 911. On arrival, he was noted to have dysarthria as well as left-sided dysmetria that was significant and therefore TPA was offered. After discussing with him and his wife, the decision was made to proceed with TPA.       Prior [place] expects to be discharged to:: Private residence Living Arrangements: Spouse/significant other Available Help at Discharge: Family;Available 24 hours/day Type of Home: House Home Access: Stairs to enter [employer] of Steps: 5 Entrance Stairs-Rails: Can reach both Home Layout: One level Home Equipment: Kasandra Knudsen - single point  Lives With: Spouse Prior Function Level of Independence: Independent with assistive device(s) Comments: was using cane PTA Communication Communication: No difficulties Dominant Hand: Right  Cognition  Cognition Arousal/Alertness: Awake/alert Behavior During Therapy: WFL for tasks assessed/performed Overall Cognitive Status:  Within Functional Limits for tasks assessed    Extremity/Trunk Assessment Upper Extremity Assessment Upper Extremity Assessment: Overall WFL for tasks assessed (R shld active flexion 100 deg) Lower Extremity Assessment Lower Extremity Assessment: RLE deficits/detail RLE Deficits / Details: grossly 3-/5, ataxic Cervical / Trunk Assessment Cervical / Trunk Assessment: Normal   Balance Balance Overall balance assessment: Needs assistance Standing balance support:  (pt requires UE support to maintain balance)  End of Session PT - End of Session Equipment Utilized During Treatment: Gait belt Activity Tolerance: Patient tolerated treatment well Patient left: in chair;with call bell/phone within reach;with family/visitor present Nurse Communication: Mobility status  GP     Kingsley Callander 09/05/2013, 5:10 PM  Kittie Plater, PT, DPT Pager #: [phone] Office #: [phone]

## 2013-09-06 DIAGNOSIS — R482 Apraxia: Secondary | ICD-10-CM

## 2013-09-06 MED ORDER — CLOPIDOGREL BISULFATE 75 MG PO TABS: 75.0000 mg | ORAL_TABLET | Freq: Every day | ORAL | Status: DC

## 2013-09-06 MED ORDER — PANTOPRAZOLE SODIUM 40 MG PO TBEC: 40.0000 mg | DELAYED_RELEASE_TABLET | Freq: Every day | ORAL | Status: DC

## 2013-09-06 MED ORDER — CLOPIDOGREL BISULFATE 75 MG PO TABS
75.0000 mg | ORAL_TABLET | Freq: Every day | ORAL | Status: DC
  Administered 2013-09-06: 75 mg via ORAL
  Filled 2013-09-06 (×2): qty 1

## 2013-09-06 NOTE — Progress Notes (Signed)
Tylenol given for pt's h/a. Dr. Nicoletta Dress aware. Will continue to monitor until d/c home.

## 2013-09-06 NOTE — Progress Notes (Signed)
Pt plans to use walker from home (neighbor to let him borrow) in lieu of ordering new one.   Ricardo Murphy

## 2013-09-06 NOTE — Progress Notes (Signed)
Occupational Therapy Evaluation Patient Details Name: ALBERTO PINA MRN: [id number] DOB: 1956-04-08 Today's Date: 09/06/2013 Time: 2355-7322 OT Time Calculation (min): 28 min  OT Assessment / Plan / Recommendation History of present illness LECIL TAPP is a 58 y.o. male who was in his normal state after a previous stroke in 2013 until 9pm tonight. At that time, he had sudden onset severe dysarthria and she was concerned for repeat stroke and therefore called 911. On arrival, he was noted to have dysarthria as well as left-sided dysmetria that was significant and therefore TPA was offered. After discussing with him and his wife, the decision was made to proceed with TPA.   Clinical Impression   Pt feels that he is at his baseline. Pt with prior L field cut from previous CVA. Discussed management of L field cut, including being assessed by opthamologist/optometrist who could assess visual fields and possibility of using prism glasses. Pt states he has been driving. Educated pt regarding the law that it is illegal to drive with a homonymous hemianopsia and recommended refraining from driving until further assessed by eye doctor. No further Ot needs at this time.    OT Assessment  Patient does not need any further OT services    Follow Up Recommendations  No OT follow up    Barriers to Discharge      Equipment Recommendations  None recommended by OT    Recommendations for Other Services Other (comment) (opthamology consult)  Frequency       Precautions / Restrictions Precautions Precautions: Fall Precaution Comments: residual R sided weakness from previous stroke Restrictions Weight Bearing Restrictions: No   Pertinent Vitals/Pain stable    ADL  Eating/Feeding: Modified independent Where Assessed - Eating/Feeding: Chair Grooming: Modified independent Where Assessed - Grooming: Unsupported sitting Upper Body Bathing: Modified independent Where Assessed - Upper Body Bathing:  Unsupported sitting Lower Body Bathing: Minimal assistance Where Assessed - Lower Body Bathing: Supported sitting Upper Body Dressing: Minimal assistance Where Assessed - Upper Body Dressing: Supported sitting Lower Body Dressing: Minimal assistance Where Assessed - Lower Body Dressing: Supported sit to Lobbyist: Magazine features editor Method: Sit to stand;Stand pivot Science writer: Comfort height toilet Toileting - Water quality scientist and Hygiene: Performed;Min guard Where Assessed - Best boy and Hygiene: Sit to stand from 3-in-1 or toilet Transfers/Ambulation Related to ADLs: S ADL Comments: mild ataxia noted LUE    OT Diagnosis:    OT Problem List:   OT Treatment Interventions:     OT Goals(Current goals can be found in the care plan section) Acute Rehab OT Goals Patient Stated Goal: home and to start outpt PT again  Visit Information  Last OT Received On: 09/06/13 Assistance Needed: +1 History of Present Illness: BRODRIC SCHAUER is a 58 y.o. male who was in his normal state after a previous stroke in 2013 until 9pm tonight. At that time, he had sudden onset severe dysarthria and she was concerned for repeat stroke and therefore called 911. On arrival, he was noted to have dysarthria as well as left-sided dysmetria that was significant and therefore TPA was offered. After discussing with him and his wife, the decision was made to proceed with TPA.       Prior [place] expects to be discharged to:: Private residence Living Arrangements: Spouse/significant other Available Help at Discharge: Family;Available 24 hours/day Type of Home: House Home Access: Stairs to enter [employer] of Steps: 5  Entrance Stairs-Rails: Can reach both Home Layout: One level Home Equipment: Cane - single point;Walker - 2 wheels;Electric scooter  Lives With: Spouse Prior Function Level of  Independence: Independent with assistive device(s) Comments: was using cane PTA Communication Communication: No difficulties Dominant Hand: Right         Vision/Perception Vision - History Baseline Vision: Other (comment) Patient Visual Report: Other (comment) (L visual field cut) Vision - Assessment Eye Alignment: Within Functional Limits Vision Assessment: Vision tested Ocular Range of Motion: Within Functional Limits Alignment/Gaze Preference: Within Defined Limits Tracking/Visual Pursuits: Decreased smoothness of horizontal tracking;Decreased smoothness of vertical tracking Saccades: Additional head turns occurred during testing;Decreased speed of saccadic movement Visual Fields: Left visual field deficit Perception Perception: Within Functional Limits Praxis Praxis: Intact   Cognition  Cognition Arousal/Alertness: Awake/alert Behavior During Therapy: WFL for tasks assessed/performed    Extremity/Trunk Assessment Upper Extremity Assessment Upper Extremity Assessment: RUE deficits/detail RUE Deficits / Details: c/o R shoulder pain.  Lower Extremity Assessment RLE Deficits / Details: grossly 3-/5, ataxic Cervical / Trunk Assessment Cervical / Trunk Assessment: Normal     Mobility Bed Mobility Overal bed mobility:  (pt up in chair upon arrival) Transfers Overall transfer level: Needs assistance Equipment used: Rolling walker (2 wheeled) Sit to Stand: Supervision     Exercise     Balance Balance Overall balance assessment: Needs assistance Dynamic Sitting - Comments: good   End of Session OT - End of Session Activity Tolerance: Patient tolerated treatment well Patient left: in chair;with call bell/phone within reach;with family/visitor present Nurse Communication: Mobility status  GO     Memori Sammon,HILLARY 09/06/2013, 3:25 PM [facility], OTR/L  [phone] 09/06/2013

## 2013-09-06 NOTE — Progress Notes (Signed)
Stroke Team Progress Note  HISTORY Ricardo Murphy is a 58 y.o. male who was in his normal state after a previous stroke in 2013 until 9pm tonight 09/04/2013. At that time, he had sudden onset severe dysarthria and she was concerned for repeat stroke and therefore called 911. On arrival, he was noted to have dysarthria as well as left-sided dysmetria that was significant and therefore TPA was offered. After discussing with him and his wife, the decision was made to proceed with TPA. Of note, he has had some abnormal movements of his right arm since his last stroke. His wife states that these movements get significantly worse whenever he is anxious, and the movements are occurring at this time. He was admitted to the neuro ICU for further evaluation and treatment.   SUBJECTIVE No family is at the bedside.  Overall he feels his condition is gradually improving. No slurred speech. Chronic right sided weakness. Stating that he is back to his baseline.   OBJECTIVE Most recent Vital Signs: Filed Vitals:   09/06/13 0400 09/06/13 0500 09/06/13 0600 09/06/13 0700  BP: 120/72 109/56 108/62 108/44  Pulse: 53 50 46 48  Temp:      TempSrc:      Resp: 17 20 16 16   Height:      Weight:      SpO2: 98% 95% 97% 96%   CBG (last 3)   Recent Labs  09/04/13 2211  GLUCAP 81    IV Fluid Intake:   . sodium chloride 50 mL/hr at 09/05/13 1710    MEDICATIONS  . clopidogrel  75 mg Oral Q breakfast  . lisinopril  20 mg Oral Daily  . pantoprazole (PROTONIX) IV  40 mg Intravenous QHS  . sertraline  50 mg Oral QHS   PRN:  acetaminophen, acetaminophen, labetalol  Diet:  Cardiac  Activity:   Up with assistance DVT Prophylaxis:  S/p TPA  CLINICALLY SIGNIFICANT STUDIES Basic Metabolic Panel:  Recent Labs Lab 09/04/13 2207 09/04/13 2214  NA 141 138  K 4.2 4.2  CL 102 101  CO2  --  26  GLUCOSE 94 92  BUN 14 14  CREATININE 1.40* 1.27  CALCIUM  --  9.1   Liver Function Tests:  Recent Labs Lab  09/04/13 2214  AST 16  ALT 17  ALKPHOS 58  BILITOT 0.2*  PROT 7.3  ALBUMIN 3.7   CBC:  Recent Labs Lab 09/04/13 2207 09/04/13 2214  WBC  --  8.1  NEUTROABS  --  4.4  HGB 15.6 15.4  HCT 46.0 44.0  MCV  --  87.0  PLT  --  258   Coagulation:  Recent Labs Lab 09/04/13 2214  LABPROT 12.1  INR 0.91   Cardiac Enzymes:  Recent Labs Lab 09/04/13 2214  TROPONINI <0.30   Urinalysis:  Recent Labs Lab 09/05/13 0600  COLORURINE YELLOW  LABSPEC 1.021  PHURINE 6.0  GLUCOSEU NEGATIVE  HGBUR NEGATIVE  BILIRUBINUR NEGATIVE  KETONESUR NEGATIVE  PROTEINUR NEGATIVE  UROBILINOGEN 0.2  NITRITE NEGATIVE  LEUKOCYTESUR NEGATIVE   Lipid Panel    Component Value Date/Time   CHOL 264* 09/05/2013 0225   TRIG 144 09/05/2013 0225   HDL 46 09/05/2013 0225   CHOLHDL 5.7 09/05/2013 0225   VLDL 29 09/05/2013 0225   LDLCALC 189* 09/05/2013 0225   HgbA1C  Lab Results  Component Value Date   HGBA1C 5.8* 09/05/2013    Urine Drug Screen:     Component Value Date/Time   LABOPIA NONE  DETECTED 09/05/2013 0600   COCAINSCRNUR NONE DETECTED 09/05/2013 0600   LABBENZ NONE DETECTED 09/05/2013 0600   AMPHETMU NONE DETECTED 09/05/2013 0600   THCU NONE DETECTED 09/05/2013 0600   LABBARB NONE DETECTED 09/05/2013 0600    Alcohol Level:  Recent Labs Lab 09/04/13 2214  ETH <11    Ct Angio Head W/cm &/or Wo Cm  09/05/2013   CLINICAL DATA:  Stroke. Right leg weakness. Received tPA last night.  EXAM: CT ANGIOGRAPHY HEAD  TECHNIQUE: Multidetector CT imaging of the head was performed using the standard protocol during bolus administration of intravenous contrast. Multiplanar CT image reconstructions including MIPs were obtained to evaluate the vascular anatomy.  CONTRAST:  90mL OMNIPAQUE IOHEXOL 350 MG/ML SOLN  COMPARISON:  Head CT 09/04/2013. Brain MRI 08/27/2013 from [place]. Head MRA 08/05/2012.  FINDINGS: Postcontrast head CT is without evidence of acute large territory cortical infarct,  mass, midline shift, intracranial hemorrhage, extra-axial fluid collection, or abnormal enhancement. Orbits are unremarkable. Paranasal sinuses and mastoid air cells are clear.  Visualized distal vertebral arteries are patent. Right vertebral artery is dominant. PICA origins are patent bilaterally. 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 Basilar artery is otherwise unremarkable. SCAs are patent. PCA origins and visualized branches are patent and unremarkable. Internal carotid arteries are patent from skullbase to carotid terminus. ACA and MCA origins and visualized branches are patent. Anterior communicating artery is patent. Posterior communicating arteries are not identified.  Review of the MIP images confirms the above findings.  IMPRESSION: Unremarkable head CTA.   Electronically Signed   By: Logan Bores   On: 09/05/2013 18:43   Ct Head Wo Contrast  09/04/2013   CLINICAL DATA:  Code stroke.  Prior history of white matter strokes.  EXAM: CT HEAD WITHOUT CONTRAST  TECHNIQUE: Contiguous axial images were obtained from the base of the skull through the vertex without intravenous contrast.  COMPARISON:  CT head and MRI brain 08/02/2012 [facility]. MRI brain 08/05/2012 [place].  FINDINGS: Ventricular system normal in size and appearance for age. No mass lesion. No midline shift. No acute hemorrhage or hematoma. No extra-axial fluid collections. No evidence of acute infarction. Previously identified very small white matter strokes on MRI are not visible on CT.  No focal osseous abnormality involving the skull. Visualized paranasal sinuses, bilateral mastoid air cells, and bilateral middle ear cavities well-aerated.  IMPRESSION: No acute intracranial abnormality. Previously identified white matter strokes on MRI are not visible on CT.  Results were discussed by telephone with the Neurohospitalist at the time of interpretation on 09/04/2013 at 2020 hr.   Electronically Signed    By: Evangeline Dakin M.D.   On: 09/04/2013 22:23   Dg Chest Portable 1 View  09/04/2013   CLINICAL DATA:  Code stroke.  EXAM: PORTABLE CHEST - 1 VIEW  COMPARISON:  Portable examination 08/02/2012. Two-view chest x-ray 07/07/2011.  FINDINGS: Suboptimal inspiration due to body habitus accounts for crowded bronchovascular markings, especially in the bases, and accentuates the cardiac silhouette. Taking this into account, cardiac silhouette mildly enlarged but stable. Stable linear scar in the lingula. Lungs otherwise clear. No localized airspace consolidation. No pleural effusions. No pneumothorax. Normal pulmonary vascularity.  IMPRESSION: Suboptimal inspiration. No acute cardiopulmonary disease. Stable cardiomegaly.   Electronically Signed   By: Evangeline Dakin M.D.   On: 09/04/2013 23:37    CT of the brain 09/04/2013 No acute intracranial abnormality. Previously identified white matter strokes on MRI are not visible on CT.  CT Angio brain 09/05/13 "Unremarkable head CTA."  MRI/A of the brain Pt cannot tolerated closed MRI  08/27/2012 done in [place], no acute abnormality   2D Echocardiogram 09/05/13 -Left ventricle: The cavity size was normal. Wall thickness was increased in a pattern of mild LVH. Systolic function was normal. The estimated ejection fraction was in the range of 55% to 60%. Wall motion was normal; there were no regional wall motion abnormalities. Left ventricular diastolic function parameters were normal. - Left atrium: The atrium was mildly dilated. - Atrial septum: No defect or patent foramen ovale was Identified.  Impressions: - No cardiac source of emboli was indentified  Carotid Doppler 09/06/13 " Bilateral: 1-39% ICA stenosis. Vertebral artery flow is antegrade. "   CXR 09/04/2013 Suboptimal inspiration. No acute cardiopulmonary disease. Stable cardiomegaly.  EKG normal sinus rhythm.    Therapy Recommendations   Physical Exam   Middle aged caucasian male not in  distress.Awake alert. Afebrile. Head is nontraumatic. Neck is supple without bruit. Hearing is normal. Cardiac exam no murmur or gallop. Lungs are clear to auscultation. Distal pulses are well felt.  Neurological Exam : Mental Status: Alert, oriented, thought content appropriate.  Speech fluent without evidence of aphasia.  Able to follow 3 step commands without difficulty. Cranial Nerves: II:  Chronic left homonymous hemianopsia, pupils equal, round, reactive to light and accommodation III,IV, VI: ptosis not present, extra-ocular motions intact bilaterally V,VII: smile symmetric, facial light touch sensation normal bilaterally VIII: hearing normal bilaterally IX,X: gag reflex present XI: bilateral shoulder shrug XII: midline tongue extension without atrophy or fasciculations  Motor: Chronic right sided weakness Right : Upper extremity   4/5    Left:     Upper extremity   5/5  Lower extremity   4/5     Lower extremity   5/5 Tone and bulk:normal tone throughout; no atrophy noted Sensory: Pinprick and light touch intact throughout, bilaterally Deep Tendon Reflexes:  Right: Upper Extremity   Left: Upper extremity   biceps (C-5 to C-6) 2/4   biceps (C-5 to C-6) 2/4 tricep (C7) 2/4    triceps (C7) 2/4 Brachioradialis (C6) 2/4  Brachioradialis (C6) 2/4  Lower Extremity Lower Extremity  quadriceps (L-2 to L-4) 2/4   quadriceps (L-2 to L-4) 2/4 Achilles (S1) 2/4   Achilles (S1) 2/4  Plantars: Right: downgoing   Left: downgoing Cerebellar: normal finger-to-nose,  normal heel-to-shin test Gait: not tested CV: pulses palpable throughout    ASSESSMENT Mr. MARIANA GOYTIA is a 59 y.o. male presenting with dysarthria and left sided dysmetria. Status post IV t-PA 09/04/2013 at 2220. Imaging pending. Suspect a right brain stroke. Etiology undetermined at this time. Prior stroke due to small vessel disease. On aspirin 81 mg orally every day prior to admission. Now on no antithrombotics as within  24h of tpa for secondary stroke prevention. Patient with resultant dysarthria (improved), right leg weakness (old, subjective).. Work up underway.  Suspected TIA Hyperlipidemia, LDL 189, intolerant to statins and zetia, so not taking, goal LDL < 100  Sinus bradycardia, asx  Likely obstructive sleep apnea, needs OP eval Per Dr. Letta Pate:  Hx Left frontal parietal infarct with right Hemiparesis resolved , still with ataxia, apraxia and balance disorder 08/06/2012\  Visual complaints Left field cut, not explained by his R frontal CVA will ask pt to followup with optho  Mild memory impairment . Expect minimal additional improvement  Post stroke anxiety, depression and emotional lability- resume Zoloft 50mg  QD  Tremor followup with neurologist  Disability papers completed, feel pt is permanently unable to resume work Family hx stroke (father)  Mild Headache       Patient reports mild headache started after his first dose of Plavix last night. No other symptoms. No evidences       suggestive of drug allergy ( no rash, fever, angioedema). No abnormal neuro exam noted except for chronic right sided weakness.                 - Tylenol 650 mg po x 1                - recs OP sleep study for OSA work up.                 - PCP follow up   Hospital day # 2  TREATMENT/PLAN clopidogrel 75 mg orally every day for secondary stroke prevention Strcit control of HT per post TPA protocol  CT angio head showed no acute abnormality. cancel MRI and MRA due to pt intolerance.  No statins given severe intolerance  Outpatient PT set up OP eval for obstructive sleep apnea  Pending DC today   Assessment and plan discussed with with attending physician and they are in agreement.  Charlann Lange, MD PGY-3  [facility]  [phone] pager   09/06/2013 7:52 AM  I have personally obtained a history, examined the patient, evaluated imaging results, and formulated the assessment and plan  of care. I agree with the above. Antony Contras, MD

## 2013-09-06 NOTE — Discharge Summary (Signed)
Stroke Discharge Summary  Patient ID: Ricardo Murphy   MRN: [id number]      DOB: Apr 26, 1956  Date of Admission: 09/04/2013 Date of Discharge: 09/06/2013  Attending Physician:  Suzzanne Cloud, MD, Stroke MD  Patient's PCP:  Charletta Cousin, MD Patient's Neurologist Karl Luke MD  Discharge Diagnoses:  1. Suspected TIA.presnered with dysarthria and left sided dysmetria treated with TPA but no infarct on MRI 2. Hyperlipidemia 3. Statin intolerance 4. Mild headache 5. Chronic left homonymous hemianopsia, etiology is unknown. 6. Sinus bradycardia, asymptomatic  7. Remote left frontal parietal infarct with residual mild right hemiparesis 8. Chronic tremor followup by his Neurologist 9. History of Anxiety/Depression    BMI: Body mass index is 39.98 kg/(m^2).  Past Medical History  Diagnosis Date  . Multiple allergies   . Dyslipidemia   . Sinus infection     a couple of months ago  . Pneumonia     4 weeks ago  . Stroke     Dec. 9, 2013   Past Surgical History  Procedure Laterality Date  . Appendectomy    . Knee arthroscopy      left  . Shoulder arthroscopy with biceps tendon repair      right      Medication List    STOP taking these medications       aspirin EC 81 MG tablet      TAKE these medications       clopidogrel 75 MG tablet  Commonly known as:  PLAVIX  Take 1 tablet (75 mg total) by mouth daily with breakfast.     hydrOXYzine 10 MG tablet  Commonly known as:  ATARAX/VISTARIL  Take 10 mg by mouth 3 (three) times daily as needed for anxiety.     lisinopril 20 MG tablet  Commonly known as:  PRINIVIL,ZESTRIL  Take 20 mg by mouth daily.     pantoprazole 40 MG tablet  Commonly known as:  PROTONIX  Take 1 tablet (40 mg total) by mouth at bedtime.     sertraline 50 MG tablet  Commonly known as:  ZOLOFT  Take 50 mg by mouth at bedtime.        LABORATORY STUDIES CBC    Component Value Date/Time   WBC 8.1 09/04/2013 2214   RBC 5.06 09/04/2013  2214   HGB 15.4 09/04/2013 2214   HCT 44.0 09/04/2013 2214   PLT 258 09/04/2013 2214   MCV 87.0 09/04/2013 2214   MCH 30.4 09/04/2013 2214   MCHC 35.0 09/04/2013 2214   RDW 12.9 09/04/2013 2214   LYMPHSABS 2.7 09/04/2013 2214   MONOABS 0.8 09/04/2013 2214   EOSABS 0.2 09/04/2013 2214   BASOSABS 0.1 09/04/2013 2214   CMP    Component Value Date/Time   NA 138 09/04/2013 2214   K 4.2 09/04/2013 2214   CL 101 09/04/2013 2214   CO2 26 09/04/2013 2214   GLUCOSE 92 09/04/2013 2214   BUN 14 09/04/2013 2214   CREATININE 1.27 09/04/2013 2214   CALCIUM 9.1 09/04/2013 2214   PROT 7.3 09/04/2013 2214   ALBUMIN 3.7 09/04/2013 2214   AST 16 09/04/2013 2214   ALT 17 09/04/2013 2214   ALKPHOS 58 09/04/2013 2214   BILITOT 0.2* 09/04/2013 2214   GFRNONAA 61* 09/04/2013 2214   GFRAA 71* 09/04/2013 2214   COAGS Lab Results  Component Value Date   INR 0.91 09/04/2013   Lipid Panel    Component Value Date/Time   CHOL 264* 09/05/2013  0225   TRIG 144 09/05/2013 0225   HDL 46 09/05/2013 0225   CHOLHDL 5.7 09/05/2013 0225   VLDL 29 09/05/2013 0225   LDLCALC 189* 09/05/2013 0225   HgbA1C  Lab Results  Component Value Date   HGBA1C 5.8* 09/05/2013   Cardiac Panel (last 3 results)   Recent Labs  09/04/13 2214  TROPONINI <0.30   Urinalysis    Component Value Date/Time   COLORURINE YELLOW 09/05/2013 0600   APPEARANCEUR CLEAR 09/05/2013 0600   LABSPEC 1.021 09/05/2013 0600   PHURINE 6.0 09/05/2013 0600   GLUCOSEU NEGATIVE 09/05/2013 0600   HGBUR NEGATIVE 09/05/2013 0600   BILIRUBINUR NEGATIVE 09/05/2013 0600   KETONESUR NEGATIVE 09/05/2013 0600   PROTEINUR NEGATIVE 09/05/2013 0600   UROBILINOGEN 0.2 09/05/2013 0600   NITRITE NEGATIVE 09/05/2013 0600   LEUKOCYTESUR NEGATIVE 09/05/2013 0600   Urine Drug Screen    Component Value Date/Time   LABOPIA NONE DETECTED 09/05/2013 0600   COCAINSCRNUR NONE DETECTED 09/05/2013 0600   LABBENZ NONE DETECTED 09/05/2013 0600   AMPHETMU NONE DETECTED 09/05/2013 0600   THCU NONE  DETECTED 09/05/2013 0600   LABBARB NONE DETECTED 09/05/2013 0600    Alcohol Level    Component Value Date/Time   [facility] <11 09/04/2013 2214   CT of the brain 09/04/2013 No acute intracranial abnormality. Previously identified white matter strokes on MRI are not visible on CT.   CT Angio brain 09/05/13 "Unremarkable head CTA."   MRI/A of the brain Pt cannot tolerated closed MRI  08/27/2012 done in [place], no acute abnormality   2D Echocardiogram 09/05/13  -Left ventricle: The cavity size was normal. Wall thickness was increased in a pattern of mild LVH. Systolic function was normal. The estimated ejection fraction was in the range of 55% to 60%. Wall motion was normal; there were no regional wall motion abnormalities. Left ventricular diastolic function parameters were normal. - Left atrium: The atrium was mildly dilated. - Atrial septum: No defect or patent foramen ovale was Identified.   Impressions: - No cardiac source of emboli was indentified   Carotid Doppler 09/06/13 " Bilateral: 1-39% ICA stenosis. Vertebral artery flow is antegrade. "   SIGNIFICANT DIAGNOSTIC STUDIES     History of Present Illness   Ricardo Murphy is a 58 y.o. male who was in his normal state after a previous stroke in 2013 until 9pm tonight 09/04/2013. At that time, he had sudden onset severe dysarthria and she was concerned for repeat stroke and therefore called 911. On arrival, he was noted to have dysarthria as well as left-sided dysmetria that was significant and therefore TPA was offered. After discussing with him and his wife, the decision was made to proceed with TPA. Of note, he has had some abnormal movements of his right arm since his last stroke. His wife states that these movements get significantly worse whenever he is anxious, and the movements are occurring at this time. He was admitted to the neuro ICU for further evaluation and treatment.   Hospital Course   1. Suspected TIA, completely resolved upon  discharge.       Patient with a history of hypertension, hyperlipidemia not on statin due to intolerance, and previous left frontal parietal infarct with residual mild right hemiparesis, who presented with dysarthria and  left-sided dysmetria. IV  After discussion of risk benefit and meeting inclusion criteria. TPA was given on admission. Patient was unable to tolerate MRI/MRA. Imaging studies including head CT and CTA showed no acute abnormalities. 2D echo  showed no emboli noted. And Carotid doppler was non revealing.        Patient is started clopidogrel 75 mg orally every day for secondary stroke prevention. His LDL is elevated at 189, and he is intolerant to statins. He is instructed to continue the lifestyle changes for the control of hyperlipidemia with a goal of LDL <100. He is largely improved, and no new neuro deficit upon discharge. He will be discharged home with outpatient PT for the management of residual right sided weakness from previous stroke.      His vascular risk factors are HTN, HLD   2. Hyperlipidemia and statin intolerance     LDL is 189. Patient is intolerant to statins. He is instructed lifestyle changes and will need to continue to follow up with his PCP as an outpatient.  3. Mild headache     Patient reported mild headache started 15 minutes after his first dose of Plavix the night on 09/05/13. No other symptoms. No evidences suggestive of drug allergy ( no rash, fever, angioedema). No abnormal neuro exam noted except for chronic right sided weakness. His headache was resolved with Tylenol 650 po x 1 dose. He and his wife described a classic symptoms of OSA, will recommend him to have outpatient sleep study to evaluate obstructive sleep apnea. I discussed with him and his wife and they agrees with the plan. Will defer it to his PCP as an outpatient.  4. Chronic left homonymous hemianopsia, etiology is unknown.     Patient reports chronic left sided visual deficit and  questionable  left homonymous hemianopsia is noted on exam. The etiology is unclear. His neurological manifestation from his previous stroke is unable to explain this deficit. We recommend him to have outpatient ophthalmology evaluation. I have discussed with patient and his wife. Will defer to his PCP management and referral as an outpatient.   5. Sinus bradycardia, asymptomatic      Stable. Defer to his PCP outpatient management  6. Remote left frontal parietal infarct with residual mild right hemiparesis, followed by his neurologist as an outpatient.  7. Chronic tremor,  followup by his Neurologist  8. History of Anxiety/depression,      resume home dose Zoloft. Will continue to follow up with his PCP as an outpatient.      Discharge Exam  Blood pressure 122/69, pulse 59, temperature 97.8 F (36.6 C), temperature source Oral, resp. rate 23, height 5\' 10"  (1.778 m), weight 278 lb 10.6 oz (126.4 kg), SpO2 95.00%.   Discharge Diet   Cardiac thin liquids  Discharge Plan    Disposition:  Home with Outpatient PT  Plavix 75 mg po daily for secondary stroke prevention.  Ongoing risk factor control by Primary Care Physician. Risk factor recommendations:  Hypertension target range 130-140/70-80 Lipid range - LDL < 100 and checked every 6 months, fasting  Need outpatient OSA workup.  Need outpatient ophthalmologist evaluation for Chronic left homonymous hemianopsia,  Follow-up THOMAS,BRAD, MD in 1 week  Follow-up with patient's neurologist Dr. Everette Rank at [facility] in 2 months. He has an appt on 11/07/13.  > 30 minutes were spent preparing discharge.  Signed Assessment and plan discussed with with attending physician and they are in agreement.  Charlann Lange, MD PGY-3  [facility]  [phone] pager   09/06/2013, 4:34 PM  I have personally examined this patient, reviewed pertinent data and developed the plan of care. I agree with above. Antony Contras, MD

## 2013-09-06 NOTE — Progress Notes (Signed)
VASCULAR LAB PRELIMINARY  PRELIMINARY  PRELIMINARY  PRELIMINARY  Carotid duplex  completed.    Preliminary report:  Bilateral:  1-39% ICA stenosis.  Vertebral artery flow is antegrade.      Helaman Mecca, RVT 09/06/2013, 12:28 PM

## 2013-09-06 NOTE — Discharge Instructions (Signed)
1. clopidogrel 75 mg orally every day for secondary stroke prevention  2. Good control of Hypertension 3. No statins given severe intolerance  4. Outpatient PT 5. Outpatient eval for obstructive sleep apnea   Clopidogrel tablets What is this medicine? CLOPIDOGREL (kloh PID oh grel) helps to prevent blood clots. This medicine is used to prevent heart attack, stroke, or other vascular events in people who are at high risk. This medicine may be used for other purposes; ask your health care provider or pharmacist if you have questions. COMMON BRAND NAME(S): Plavix What should I tell my health care provider before I take this medicine? They need to know if you have any of the following conditions: -bleeding disorder -bleeding in the brain -planned surgery -stomach or intestinal ulcers -stroke or transient ischemic attack -an unusual or allergic reaction to clopidogrel, other medicines, foods, dyes, or preservatives -pregnant or trying to get pregnant -breast-feeding How should I use this medicine? Take this medicine by mouth with a drink of water. Follow the directions on the prescription label. You may take this medicine with or without food. Take your medicine at regular intervals. Do not take your medicine more often than directed. Talk to your pediatrician regarding the use of this medicine in children. Special care may be needed. Overdosage: If you think you have taken too much of this medicine contact a poison control center or emergency room at once. NOTE: This medicine is only for you. Do not share this medicine with others. What if I miss a dose? If you miss a dose, take it as soon as you can. If it is almost time for your next dose, take only that dose. Do not take double or extra doses. What may interact with this medicine? -aspirin -blood thinners like cilostazol, enoxaparin, ticlopidine, and warfarin -certain medicines for depression like citalopram, fluoxetine, and  fluvoxamine -certain medicines for fungal infections like ketoconazole, fluconazole, and voriconazole -certain medicines for HIV infection like delavirdine, efavirenz, and etravirine -certain medicines for seizures like felbamate, oxcarbazepine, and phenytoin -chloramphenicol -fluvastatin -isoniazid, INH -medicines for inflammation like ibuprofen and naproxen -modafinil -nicardipine -over-the counter supplements like echinacea, feverfew, fish oil, garlic, ginger, ginkgo, green tea, horse chestnut -quinine -stomach acid blockers like cimetidine, omeprazole, and esomeprazole -tamoxifen -tolbutamide -topiramate -torsemide This list may not describe all possible interactions. Give your health care provider a list of all the medicines, herbs, non-prescription drugs, or dietary supplements you use. Also tell them if you smoke, drink alcohol, or use illegal drugs. Some items may interact with your medicine. What should I watch for while using this medicine? Visit your doctor or health care professional for regular check ups. Do not stop taking your medicine unless your doctor tells you to. Notify your doctor or health care professional and seek emergency treatment if you develop breathing problems; changes in vision; chest pain; severe, sudden headache; pain, swelling, warmth in the leg; trouble speaking; sudden numbness or weakness of the face, arm or leg. These can be signs that your condition has gotten worse. If you are going to have surgery or dental work, tell your doctor or health care professional that you are taking this medicine. Certain genetic factors may reduce the effect of this medicine. Your doctor may use genetic tests to determine treatment. What side effects may I notice from receiving this medicine? Side effects that you should report to your doctor or health care professional as soon as possible: -allergic reactions like skin rash, itching or hives, swelling of the  face, lips,  or tongue -breathing problems -changes in vision -fever -signs and symptoms of bleeding such as bloody or black, tarry stools; red or dark-brown urine; spitting up blood or brown material that looks like coffee grounds; red spots on the skin; unusual bruising or bleeding from the eye, gums, or nose -sudden weakness -unusual bleeding or bruising Side effects that usually do not require medical attention (report to your doctor or health care professional if they continue or are bothersome): -constipation or diarrhea -headache -pain in back or joints -stomach upset This list may not describe all possible side effects. Call your doctor for medical advice about side effects. You may report side effects to FDA at 1-800-FDA-1088. Where should I keep my medicine? Keep out of the reach of children. Store at room temperature of 59 to 86 degrees F (15 to 30 degrees C). Throw away any unused medicine after the expiration date. NOTE: This sheet is a summary. It may not cover all possible information. If you have questions about this medicine, talk to your doctor, pharmacist, or health care provider.  2014, Elsevier/Gold Standard. (2012-12-07 16:34:37)

## 2013-09-06 NOTE — Progress Notes (Signed)
Pt given d/c instructions and education documents on stroke. Questions answered. IV d/c at 13:20, small infiltration. MD aware. Pt wheeled out by NT to d/c home with wife.   Ricardo Murphy

## 2013-09-06 NOTE — Progress Notes (Signed)
Pt reporting h/a and warm flash after Plavix given. Stroke NP & MD made aware. Ok to continue with d/c home per stroke team and pt was recommended tylenol for home h/a management.

## 2013-09-06 NOTE — Progress Notes (Signed)
Pt with orders for outpatient therapies following neuro event. Dr. Nicoletta Dress completed form for outpt  Therapies at [facility] on [address]. Faxed that to rehab facility. Explained to assigned RN and to patient. Pt given document with rehab's location address and phone number on it.

## 2013-10-27 ENCOUNTER — Ambulatory Visit: Payer: 59 | Admitting: Cardiovascular Disease

## 2013-11-10 ENCOUNTER — Encounter: Payer: BC Managed Care – PPO | Attending: Physical Medicine & Rehabilitation

## 2013-11-10 ENCOUNTER — Ambulatory Visit (HOSPITAL_BASED_OUTPATIENT_CLINIC_OR_DEPARTMENT_OTHER): Payer: BC Managed Care – PPO | Admitting: Physical Medicine & Rehabilitation

## 2013-11-10 ENCOUNTER — Encounter: Payer: Self-pay | Admitting: Physical Medicine & Rehabilitation

## 2013-11-10 VITALS — BP 153/90 | HR 73 | Resp 14 | Ht 71.0 in | Wt 274.0 lb

## 2013-11-10 DIAGNOSIS — F411 Generalized anxiety disorder: Secondary | ICD-10-CM | POA: Insufficient documentation

## 2013-11-10 DIAGNOSIS — R279 Unspecified lack of coordination: Secondary | ICD-10-CM | POA: Insufficient documentation

## 2013-11-10 DIAGNOSIS — I69993 Ataxia following unspecified cerebrovascular disease: Secondary | ICD-10-CM | POA: Insufficient documentation

## 2013-11-10 DIAGNOSIS — F329 Major depressive disorder, single episode, unspecified: Secondary | ICD-10-CM | POA: Insufficient documentation

## 2013-11-10 DIAGNOSIS — E785 Hyperlipidemia, unspecified: Secondary | ICD-10-CM | POA: Insufficient documentation

## 2013-11-10 DIAGNOSIS — R413 Other amnesia: Secondary | ICD-10-CM | POA: Insufficient documentation

## 2013-11-10 DIAGNOSIS — H539 Unspecified visual disturbance: Secondary | ICD-10-CM | POA: Insufficient documentation

## 2013-11-10 DIAGNOSIS — I69959 Hemiplegia and hemiparesis following unspecified cerebrovascular disease affecting unspecified side: Secondary | ICD-10-CM | POA: Insufficient documentation

## 2013-11-10 DIAGNOSIS — R482 Apraxia: Secondary | ICD-10-CM

## 2013-11-10 DIAGNOSIS — I69998 Other sequelae following unspecified cerebrovascular disease: Secondary | ICD-10-CM | POA: Insufficient documentation

## 2013-11-10 DIAGNOSIS — F3289 Other specified depressive episodes: Secondary | ICD-10-CM | POA: Insufficient documentation

## 2013-11-10 NOTE — Patient Instructions (Signed)
Cont ambulation with cane

## 2013-11-10 NOTE — Progress Notes (Signed)
Subjective:    Patient ID: Ricardo Murphy, male    DOB: 1955/09/10, 58 y.o.   MRN: [id number]  HPI URI Sees Dr Raiford Simmonds from neurology in [facility], plans to go back to start a new medication  No longer receiving LTD payments Review letter from [place] suggesting Security guard, dispatcher or Pharmacist, hospital Pain Inventory Average Pain 0 Pain Right Now 0 My pain is no pain  In the last 24 hours, has pain interfered with the following? General activity 0 Relation with others 0 Enjoyment of life 0 What TIME of day is your pain at its worst? no pain Sleep (in general) Fair  Pain is worse with: no pain Pain improves with: no pain Relief from Meds: no pain  Mobility use a cane  Function Do you have any goals in this area?  no  Neuro/Psych No problems in this area  Prior Studies Any changes since last visit?  no  Physicians involved in your care Any changes since last visit?  no   Family History  Problem Relation Age of Onset  . Cancer - Other Mother   . Diabetes Mother   . Valvular heart disease Mother   . CVA Father    History   Social History  . Marital Status: Married    Spouse Name: N/A    Number of Children: N/A  . Years of Education: N/A   Social History Main Topics  . Smoking status: Never Smoker   . Smokeless tobacco: Never Used  . Alcohol Use: No  . Drug Use: No  . Sexual Activity: None   Other Topics Concern  . None   Social History Narrative  . None   Past Surgical History  Procedure Laterality Date  . Appendectomy    . Knee arthroscopy      left  . Shoulder arthroscopy with biceps tendon repair      right   Past Medical History  Diagnosis Date  . Multiple allergies   . Dyslipidemia   . Sinus infection     a couple of months ago  . Pneumonia     4 weeks ago  . Stroke     Dec. 9, 2013   BP 153/90  Pulse 73  Resp 14  Ht 5\' 11"  (1.803 m)  Wt 274 lb (124.286 kg)  BMI 38.23 kg/m2  SpO2 97%  Opioid Risk Score:     Fall Risk Score: Moderate Fall Risk (6-13 points) (educated and handout given for fall preventionin home)  Review of Systems  Musculoskeletal: Positive for gait problem.  All other systems reviewed and are negative.       Objective:   Physical Exam  Psychiatric: His affect is blunt. His speech is delayed. He is slowed. He is inattentive.    Physical Exam  Left temporal visual fields cut with confrontation testing.  Show no evidence of nystagmus  Her strength is 5/5 bilateral deltoid, bicep, tricep and grip  Resting tremor head and hands.  Sensation intact to light touch in bilateral upper limits.  Ambulates with a cane.  Evidence of apraxia in the right upper and right lower limb       Assessment & Plan:  1. Left frontal parietal infarct with right Hemiparesis resolved , still with ataxia, apraxia and balance disorder  2. Visual complaints Left field cut, not explained by his R frontal CVA will ask pt to followup with optho  3. Mild memory impairment . Has delayed responses, poor  attention, increased latency of response  Expect minimal additional improvement - this will negatively impact sedentary work 4. Post stroke anxiety, depression and emotional lability-off  Zoloft 50mg  QD, mild improvement with tremor mild anxiety at times, especially in crowds  5. Tremor followup with neurologist  RTC 6 mo   feel pt is permanently unable to resume work of any type

## 2013-12-13 ENCOUNTER — Ambulatory Visit: Payer: 59 | Admitting: Physical Medicine & Rehabilitation

## 2013-12-13 ENCOUNTER — Encounter: Payer: 59 | Attending: Physical Medicine & Rehabilitation

## 2013-12-13 DIAGNOSIS — I69959 Hemiplegia and hemiparesis following unspecified cerebrovascular disease affecting unspecified side: Secondary | ICD-10-CM | POA: Insufficient documentation

## 2013-12-13 DIAGNOSIS — H539 Unspecified visual disturbance: Secondary | ICD-10-CM | POA: Insufficient documentation

## 2013-12-13 DIAGNOSIS — F3289 Other specified depressive episodes: Secondary | ICD-10-CM | POA: Insufficient documentation

## 2013-12-13 DIAGNOSIS — I69993 Ataxia following unspecified cerebrovascular disease: Secondary | ICD-10-CM | POA: Insufficient documentation

## 2013-12-13 DIAGNOSIS — E785 Hyperlipidemia, unspecified: Secondary | ICD-10-CM | POA: Insufficient documentation

## 2013-12-13 DIAGNOSIS — R413 Other amnesia: Secondary | ICD-10-CM | POA: Insufficient documentation

## 2013-12-13 DIAGNOSIS — I69998 Other sequelae following unspecified cerebrovascular disease: Secondary | ICD-10-CM | POA: Insufficient documentation

## 2013-12-13 DIAGNOSIS — F411 Generalized anxiety disorder: Secondary | ICD-10-CM | POA: Insufficient documentation

## 2013-12-13 DIAGNOSIS — F329 Major depressive disorder, single episode, unspecified: Secondary | ICD-10-CM | POA: Insufficient documentation

## 2013-12-13 DIAGNOSIS — R279 Unspecified lack of coordination: Secondary | ICD-10-CM | POA: Insufficient documentation

## 2014-01-27 ENCOUNTER — Encounter: Payer: 59 | Attending: Physical Medicine & Rehabilitation

## 2014-01-27 ENCOUNTER — Ambulatory Visit: Payer: 59 | Admitting: Physical Medicine & Rehabilitation

## 2014-01-27 DIAGNOSIS — F3289 Other specified depressive episodes: Secondary | ICD-10-CM | POA: Insufficient documentation

## 2014-01-27 DIAGNOSIS — I69959 Hemiplegia and hemiparesis following unspecified cerebrovascular disease affecting unspecified side: Secondary | ICD-10-CM | POA: Insufficient documentation

## 2014-01-27 DIAGNOSIS — H539 Unspecified visual disturbance: Secondary | ICD-10-CM | POA: Insufficient documentation

## 2014-01-27 DIAGNOSIS — R413 Other amnesia: Secondary | ICD-10-CM | POA: Insufficient documentation

## 2014-01-27 DIAGNOSIS — F329 Major depressive disorder, single episode, unspecified: Secondary | ICD-10-CM | POA: Insufficient documentation

## 2014-01-27 DIAGNOSIS — I69993 Ataxia following unspecified cerebrovascular disease: Secondary | ICD-10-CM | POA: Insufficient documentation

## 2014-01-27 DIAGNOSIS — F411 Generalized anxiety disorder: Secondary | ICD-10-CM | POA: Insufficient documentation

## 2014-01-27 DIAGNOSIS — R279 Unspecified lack of coordination: Secondary | ICD-10-CM | POA: Insufficient documentation

## 2014-01-27 DIAGNOSIS — E785 Hyperlipidemia, unspecified: Secondary | ICD-10-CM | POA: Insufficient documentation

## 2014-01-27 DIAGNOSIS — I69998 Other sequelae following unspecified cerebrovascular disease: Secondary | ICD-10-CM | POA: Insufficient documentation

## 2014-05-09 ENCOUNTER — Encounter: Payer: BC Managed Care – PPO | Attending: Physical Medicine & Rehabilitation

## 2014-05-09 ENCOUNTER — Ambulatory Visit (HOSPITAL_BASED_OUTPATIENT_CLINIC_OR_DEPARTMENT_OTHER): Payer: BC Managed Care – PPO | Admitting: Physical Medicine & Rehabilitation

## 2014-05-09 ENCOUNTER — Encounter: Payer: Self-pay | Admitting: Physical Medicine & Rehabilitation

## 2014-05-09 VITALS — BP 117/62 | HR 65 | Resp 14 | Ht 70.0 in | Wt 267.0 lb

## 2014-05-09 DIAGNOSIS — M109 Gout, unspecified: Secondary | ICD-10-CM

## 2014-05-09 DIAGNOSIS — Z1211 Encounter for screening for malignant neoplasm of colon: Secondary | ICD-10-CM | POA: Insufficient documentation

## 2014-05-09 DIAGNOSIS — R5383 Other fatigue: Secondary | ICD-10-CM

## 2014-05-09 DIAGNOSIS — H539 Unspecified visual disturbance: Secondary | ICD-10-CM | POA: Diagnosis not present

## 2014-05-09 DIAGNOSIS — I10 Essential (primary) hypertension: Secondary | ICD-10-CM | POA: Insufficient documentation

## 2014-05-09 DIAGNOSIS — I69959 Hemiplegia and hemiparesis following unspecified cerebrovascular disease affecting unspecified side: Secondary | ICD-10-CM | POA: Insufficient documentation

## 2014-05-09 DIAGNOSIS — R0602 Shortness of breath: Secondary | ICD-10-CM

## 2014-05-09 DIAGNOSIS — E669 Obesity, unspecified: Secondary | ICD-10-CM | POA: Insufficient documentation

## 2014-05-09 DIAGNOSIS — R482 Apraxia: Secondary | ICD-10-CM

## 2014-05-09 DIAGNOSIS — F411 Generalized anxiety disorder: Secondary | ICD-10-CM | POA: Diagnosis not present

## 2014-05-09 DIAGNOSIS — L270 Generalized skin eruption due to drugs and medicaments taken internally: Secondary | ICD-10-CM

## 2014-05-09 DIAGNOSIS — M629 Disorder of muscle, unspecified: Secondary | ICD-10-CM

## 2014-05-09 DIAGNOSIS — F329 Major depressive disorder, single episode, unspecified: Secondary | ICD-10-CM | POA: Insufficient documentation

## 2014-05-09 DIAGNOSIS — I69359 Hemiplegia and hemiparesis following cerebral infarction affecting unspecified side: Secondary | ICD-10-CM

## 2014-05-09 DIAGNOSIS — I69993 Ataxia following unspecified cerebrovascular disease: Secondary | ICD-10-CM

## 2014-05-09 DIAGNOSIS — F4001 Agoraphobia with panic disorder: Secondary | ICD-10-CM

## 2014-05-09 DIAGNOSIS — R413 Other amnesia: Secondary | ICD-10-CM | POA: Insufficient documentation

## 2014-05-09 DIAGNOSIS — E785 Hyperlipidemia, unspecified: Secondary | ICD-10-CM

## 2014-05-09 DIAGNOSIS — E668 Other obesity: Secondary | ICD-10-CM

## 2014-05-09 DIAGNOSIS — F419 Anxiety disorder, unspecified: Secondary | ICD-10-CM

## 2014-05-09 DIAGNOSIS — R279 Unspecified lack of coordination: Secondary | ICD-10-CM | POA: Diagnosis not present

## 2014-05-09 DIAGNOSIS — I69998 Other sequelae following unspecified cerebrovascular disease: Secondary | ICD-10-CM | POA: Insufficient documentation

## 2014-05-09 DIAGNOSIS — Z23 Encounter for immunization: Secondary | ICD-10-CM | POA: Insufficient documentation

## 2014-05-09 DIAGNOSIS — E790 Hyperuricemia without signs of inflammatory arthritis and tophaceous disease: Secondary | ICD-10-CM

## 2014-05-09 DIAGNOSIS — T50905S Adverse effect of unspecified drugs, medicaments and biological substances, sequela: Secondary | ICD-10-CM | POA: Insufficient documentation

## 2014-05-09 DIAGNOSIS — I639 Cerebral infarction, unspecified: Secondary | ICD-10-CM

## 2014-05-09 DIAGNOSIS — Z79899 Other long term (current) drug therapy: Secondary | ICD-10-CM

## 2014-05-09 DIAGNOSIS — R0683 Snoring: Secondary | ICD-10-CM

## 2014-05-09 DIAGNOSIS — F339 Major depressive disorder, recurrent, unspecified: Secondary | ICD-10-CM

## 2014-05-09 DIAGNOSIS — G40909 Epilepsy, unspecified, not intractable, without status epilepticus: Secondary | ICD-10-CM

## 2014-05-09 DIAGNOSIS — L299 Pruritus, unspecified: Secondary | ICD-10-CM | POA: Insufficient documentation

## 2014-05-09 DIAGNOSIS — M2550 Pain in unspecified joint: Secondary | ICD-10-CM

## 2014-05-09 DIAGNOSIS — T466X5A Adverse effect of antihyperlipidemic and antiarteriosclerotic drugs, initial encounter: Secondary | ICD-10-CM

## 2014-05-09 DIAGNOSIS — H538 Other visual disturbances: Secondary | ICD-10-CM | POA: Insufficient documentation

## 2014-05-09 DIAGNOSIS — F3289 Other specified depressive episodes: Secondary | ICD-10-CM | POA: Insufficient documentation

## 2014-05-09 DIAGNOSIS — Z125 Encounter for screening for malignant neoplasm of prostate: Secondary | ICD-10-CM

## 2014-05-09 HISTORY — DX: Pruritus, unspecified: L29.9

## 2014-05-09 HISTORY — DX: Hyperuricemia without signs of inflammatory arthritis and tophaceous disease: E79.0

## 2014-05-09 HISTORY — DX: Pain in unspecified joint: M25.50

## 2014-05-09 HISTORY — DX: Other visual disturbances: H53.8

## 2014-05-09 HISTORY — DX: Obesity, unspecified: E66.9

## 2014-05-09 HISTORY — DX: Cerebral infarction, unspecified: I63.9

## 2014-05-09 HISTORY — DX: Other fatigue: R53.83

## 2014-05-09 HISTORY — DX: Epilepsy, unspecified, not intractable, without status epilepticus: G40.909

## 2014-05-09 HISTORY — DX: Anxiety disorder, unspecified: F41.9

## 2014-05-09 HISTORY — DX: Hyperlipidemia, unspecified: E78.5

## 2014-05-09 HISTORY — DX: Generalized skin eruption due to drugs and medicaments taken internally: L27.0

## 2014-05-09 HISTORY — DX: Adverse effect of unspecified drugs, medicaments and biological substances, sequela: T50.905S

## 2014-05-09 HISTORY — DX: Adverse effect of antihyperlipidemic and antiarteriosclerotic drugs, initial encounter: T46.6X5A

## 2014-05-09 HISTORY — DX: Shortness of breath: R06.02

## 2014-05-09 HISTORY — DX: Encounter for screening for malignant neoplasm of prostate: Z12.5

## 2014-05-09 HISTORY — DX: Hemiplegia and hemiparesis following cerebral infarction affecting unspecified side: I69.359

## 2014-05-09 HISTORY — DX: Other obesity: E66.8

## 2014-05-09 HISTORY — DX: Essential (primary) hypertension: I10

## 2014-05-09 HISTORY — DX: Agoraphobia with panic disorder: F40.01

## 2014-05-09 HISTORY — DX: Encounter for screening for malignant neoplasm of colon: Z12.11

## 2014-05-09 HISTORY — DX: Disorder of muscle, unspecified: M62.9

## 2014-05-09 HISTORY — DX: Snoring: R06.83

## 2014-05-09 HISTORY — DX: Major depressive disorder, recurrent, unspecified: F33.9

## 2014-05-09 HISTORY — DX: Gout, unspecified: M10.9

## 2014-05-09 HISTORY — DX: Other long term (current) drug therapy: Z79.899

## 2014-05-09 NOTE — Patient Instructions (Addendum)
As needed follow up  Pace yourself!

## 2014-05-09 NOTE — Progress Notes (Signed)
Subjective:    Patient ID: Ricardo Murphy, male    DOB: [birth year], 58 y.o.   MRN: [id number]  HPI Feels like he has vertigo "leg and head not coming together" Fall last week and couple times the week before Feels like his leg is "not there" Was in PT up until a couple months ago Pain Inventory Average Pain 0 Pain Right Now 0 My pain is no pain  In the last 24 hours, has pain interfered with the following? General activity 0 Relation with others 0 Enjoyment of life 0 What TIME of day is your pain at its worst? na Sleep (in general) Good  Pain is worse with: walking Pain improves with: na Relief from Meds: no pain meds  Mobility walk with assistance use a cane do you drive?  yes transfers alone  Function retired  Neuro/Psych trouble walking confusion anxiety  Prior Studies Any changes since last visit?  no  Physicians involved in your care Any changes since last visit?  no   Family History  Problem Relation Age of Onset  . Cancer - Other Mother   . Diabetes Mother   . Valvular heart disease Mother   . CVA Father    History   Social History  . Marital Status: Married    Spouse Name: N/A    Number of Children: N/A  . Years of Education: N/A   Social History Main Topics  . Smoking status: Never Smoker   . Smokeless tobacco: Never Used  . Alcohol Use: No  . Drug Use: No  . Sexual Activity: None   Other Topics Concern  . None   Social History Narrative  . None   Past Surgical History  Procedure Laterality Date  . Appendectomy    . Knee arthroscopy      left  . Shoulder arthroscopy with biceps tendon repair      right   Past Medical History  Diagnosis Date  . Multiple allergies   . Dyslipidemia   . Sinus infection     a couple of months ago  . Pneumonia     4 weeks ago  . Stroke     Dec. 9, 2013   BP 117/62  Pulse 65  Resp 14  Ht 5\' 10"  (1.778 m)  Wt 267 lb (121.11 kg)  BMI 38.31 kg/m2  SpO2 98%  Opioid Risk Score:     Fall Risk Score: High Fall Risk (>13 points) (pt educated,declined handout)   Review of Systems  Musculoskeletal: Positive for gait problem.  Psychiatric/Behavioral: Positive for confusion. The patient is nervous/anxious.   All other systems reviewed and are negative.      Objective:   Physical Exam  Nursing note and vitals reviewed. Constitutional: He is oriented to person, place, and time. He appears well-developed and well-nourished.  HENT:  Head: Normocephalic and atraumatic.  Eyes: Conjunctivae and EOM are normal. Pupils are equal, round, and reactive to light.  Neurological: He is alert and oriented to person, place, and time.   Psychiatric: His affect is blunt. His speech is delayed. He is slowed. He is inattentive.   Physical Exam  Left temporal visual fields intact with confrontation testing.  Show no evidence of nystagmus  Her strength is 5/5 bilateral deltoid, bicep, tricep and grip  Resting tremor head and hands.  Sensation intact to light touch in bilateral upper limits.  Ambulates with a cane.  Evidence of apraxia in the right upper and right lower limb EOMI  Assessment & Plan:  1. Left frontal parietal infarct with right Hemiparesis resolved , still with ataxia, apraxia and balance disorder  2. Visual complaints  optho eval , pt plans follow up 3. Mild memory impairment . Has delayed responses, poor attention, increased latency of response Expect minimal additional improvement - this will negatively impact sedentary work  4. Post stroke anxiety, depression and emotional lability-off Zoloft 50mg  QD doing ok                            5. Tremor followup with neurologist , no new meds RTC prn feel pt is permanently unable to resume work of any type

## 2015-11-06 DIAGNOSIS — M109 Gout, unspecified: Secondary | ICD-10-CM | POA: Diagnosis not present

## 2015-11-06 DIAGNOSIS — Z87898 Personal history of other specified conditions: Secondary | ICD-10-CM | POA: Diagnosis not present

## 2015-11-06 DIAGNOSIS — F419 Anxiety disorder, unspecified: Secondary | ICD-10-CM | POA: Diagnosis not present

## 2015-11-06 DIAGNOSIS — E782 Mixed hyperlipidemia: Secondary | ICD-10-CM | POA: Diagnosis not present

## 2015-11-06 DIAGNOSIS — I69320 Aphasia following cerebral infarction: Secondary | ICD-10-CM | POA: Diagnosis not present

## 2015-11-06 DIAGNOSIS — J301 Allergic rhinitis due to pollen: Secondary | ICD-10-CM | POA: Diagnosis not present

## 2015-11-06 DIAGNOSIS — I130 Hypertensive heart and chronic kidney disease with heart failure and stage 1 through stage 4 chronic kidney disease, or unspecified chronic kidney disease: Secondary | ICD-10-CM | POA: Diagnosis not present

## 2015-11-06 DIAGNOSIS — I69351 Hemiplegia and hemiparesis following cerebral infarction affecting right dominant side: Secondary | ICD-10-CM | POA: Diagnosis not present

## 2015-11-06 DIAGNOSIS — N182 Chronic kidney disease, stage 2 (mild): Secondary | ICD-10-CM | POA: Diagnosis not present

## 2015-11-27 DIAGNOSIS — R251 Tremor, unspecified: Secondary | ICD-10-CM | POA: Diagnosis not present

## 2015-11-27 DIAGNOSIS — F4323 Adjustment disorder with mixed anxiety and depressed mood: Secondary | ICD-10-CM | POA: Diagnosis not present

## 2015-11-27 DIAGNOSIS — I69351 Hemiplegia and hemiparesis following cerebral infarction affecting right dominant side: Secondary | ICD-10-CM | POA: Diagnosis not present

## 2015-11-28 DIAGNOSIS — F4323 Adjustment disorder with mixed anxiety and depressed mood: Secondary | ICD-10-CM

## 2015-11-28 HISTORY — DX: Adjustment disorder with mixed anxiety and depressed mood: F43.23

## 2015-12-04 DIAGNOSIS — M109 Gout, unspecified: Secondary | ICD-10-CM | POA: Diagnosis not present

## 2015-12-24 IMAGING — CT CT HEAD W/O CM
1 series · 16 of 30 positions shown, 20 images · non-contrast
Comparison: CT head and MRI brain 08/02/2012 [facility]. MRI
brain 08/05/2012 [HOSPITAL].

CLINICAL DATA: Code stroke.  Prior history of white matter strokes.

EXAM:
CT HEAD WITHOUT CONTRAST
TECHNIQUE: Contiguous axial images were obtained from the base of the skull
through the vertex without intravenous contrast.

[Series 2: head 5.0 h30s · axial · 0.45mm/px · z∈[-126,+19]mm · 16 of 33 slices shown, 20 images]
[im 2/33  brain]
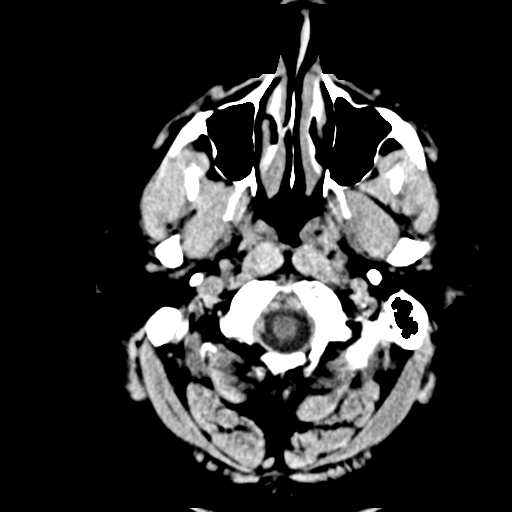
[im 2/33  bone]
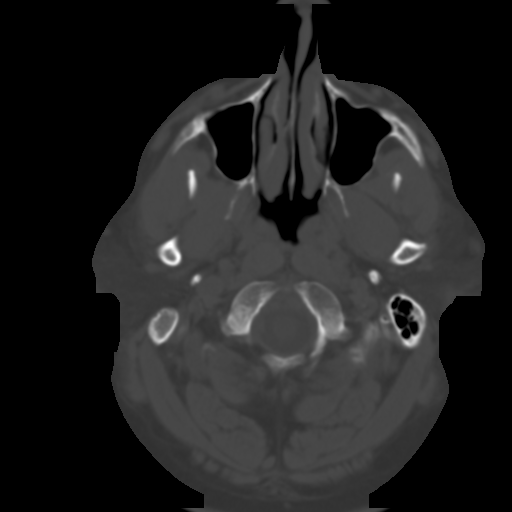
[im 4/33  brain]
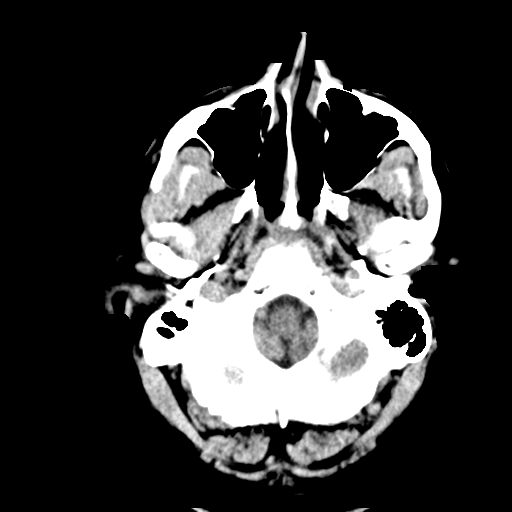
[im 6/33  brain]
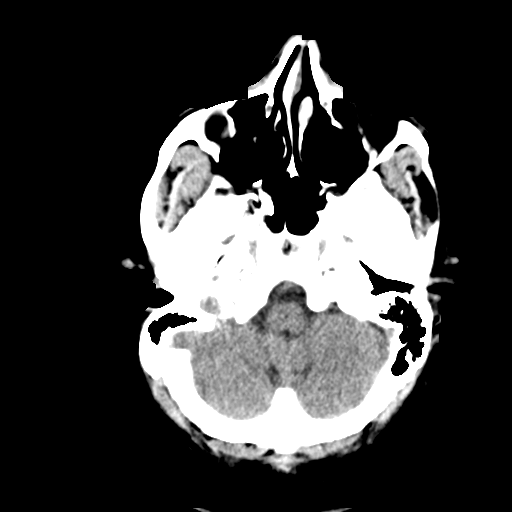
[im 8/33  brain]
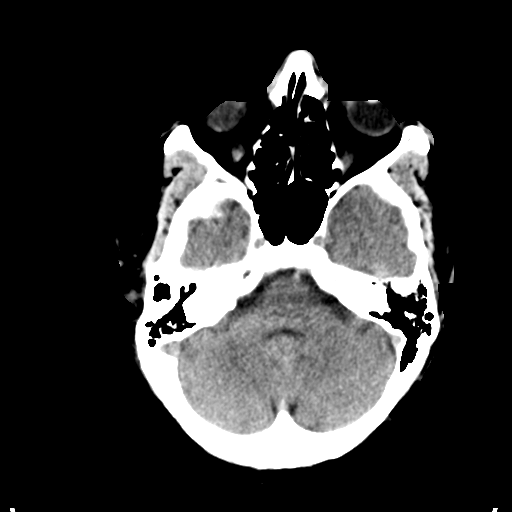
[im 9/33  brain]
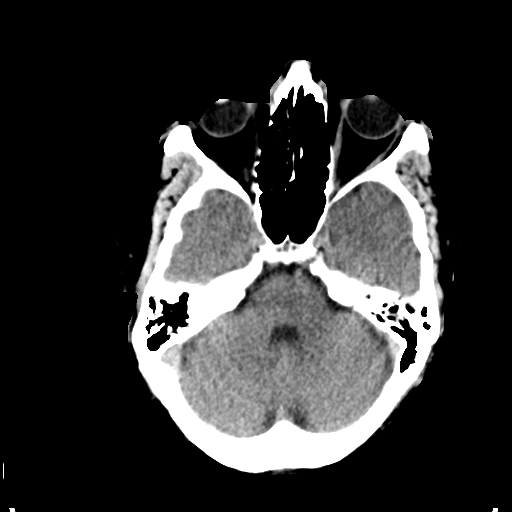
[im 9/33  bone]
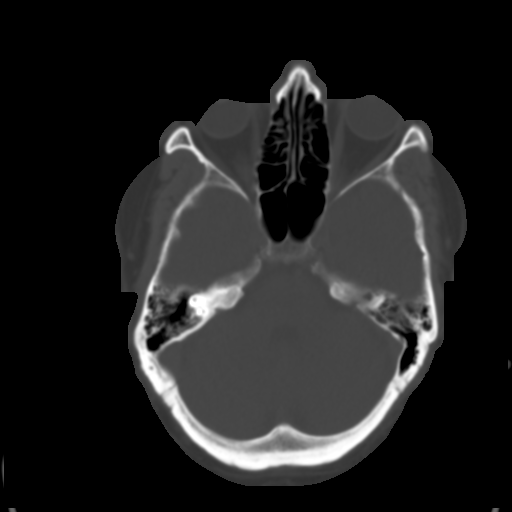
[im 12/33  brain]
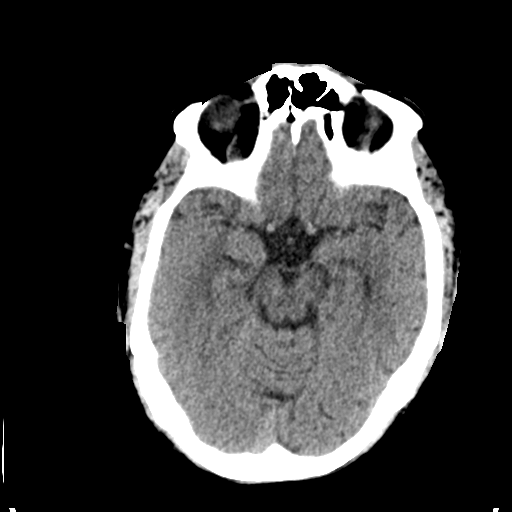
[im 14/33  brain]
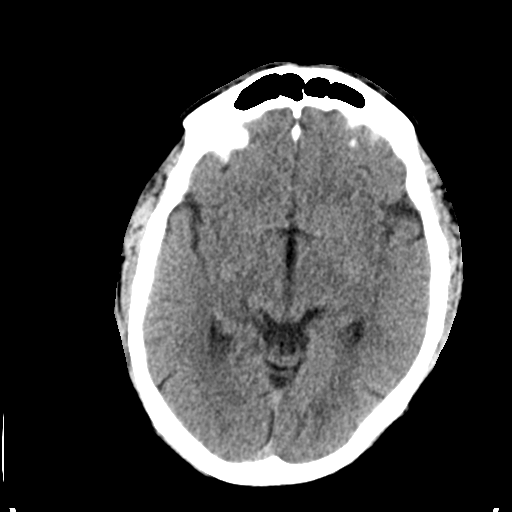
[im 16/33  brain]
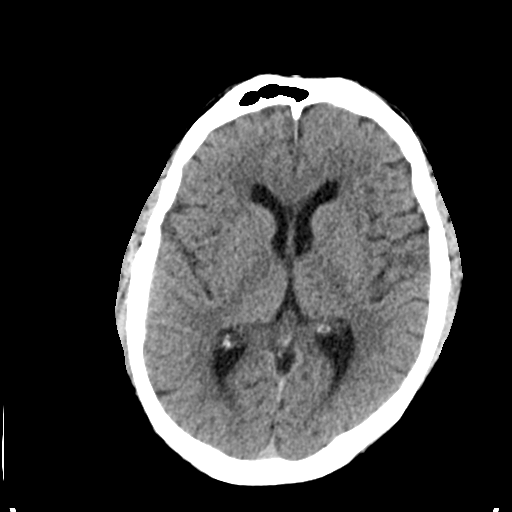
[im 17/33  brain]
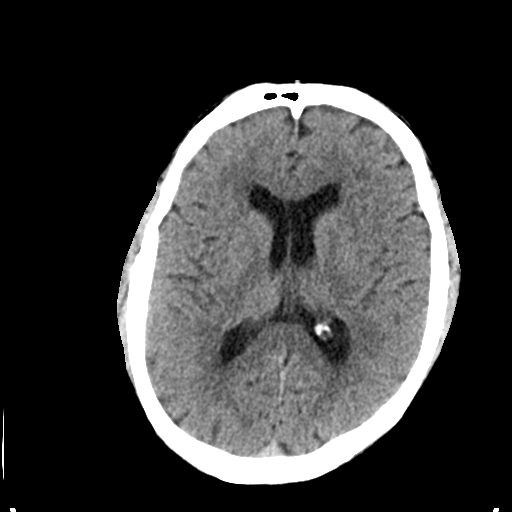
[im 17/33  bone]
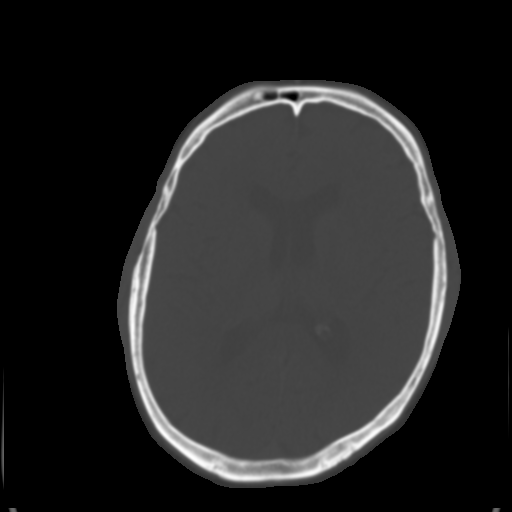
[im 19/33  brain]
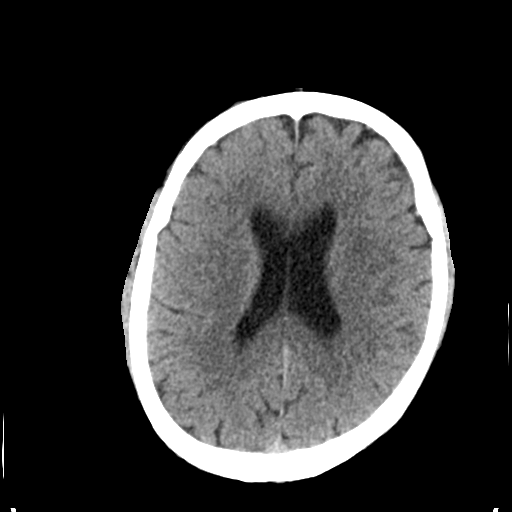
[im 21/33  brain]
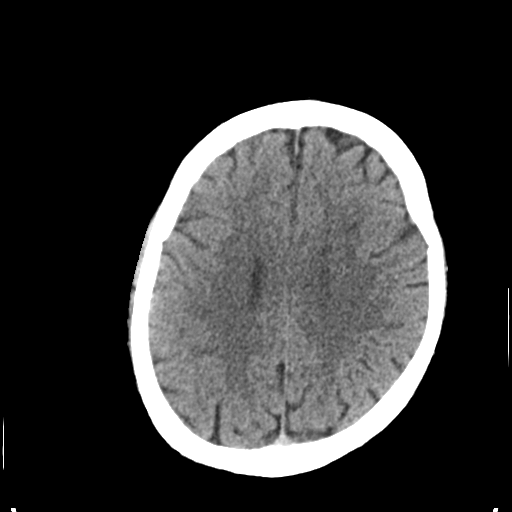
[im 24/33  brain]
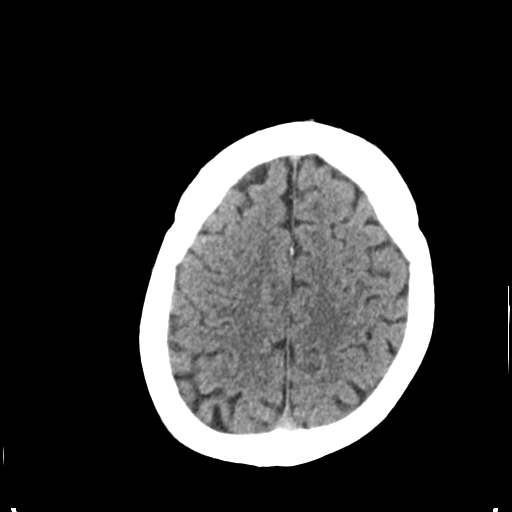
[im 25/33  brain]
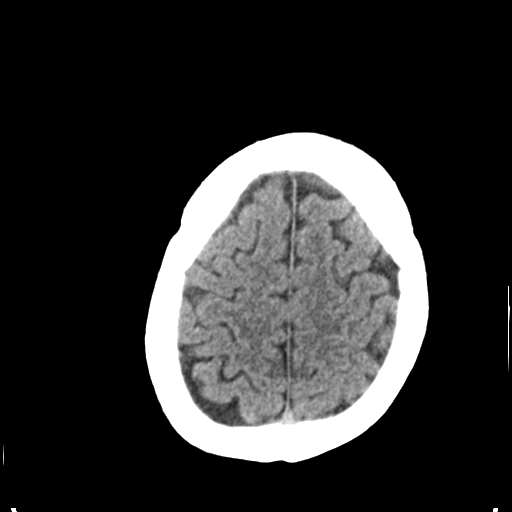
[im 25/33  bone]
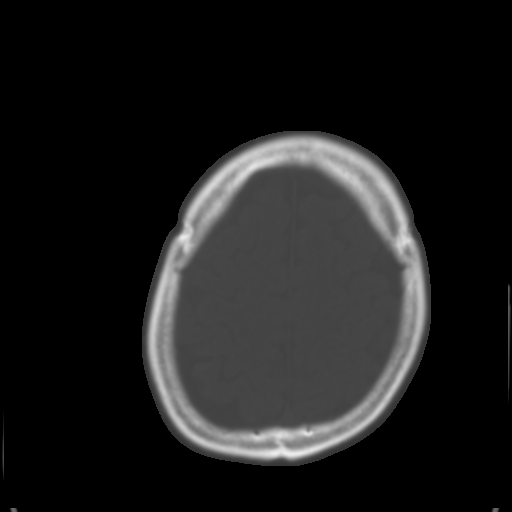
[im 27/33  brain]
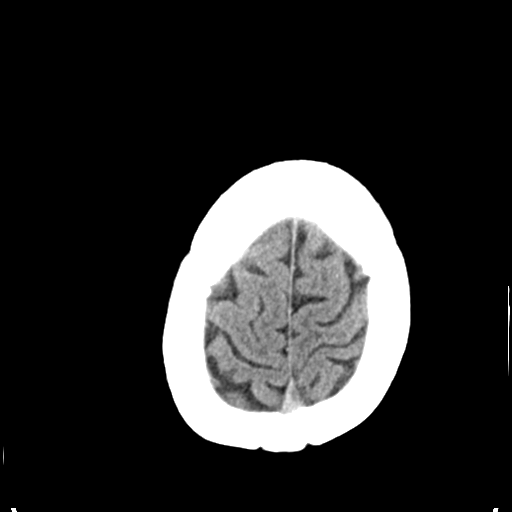
[im 29/33  brain]
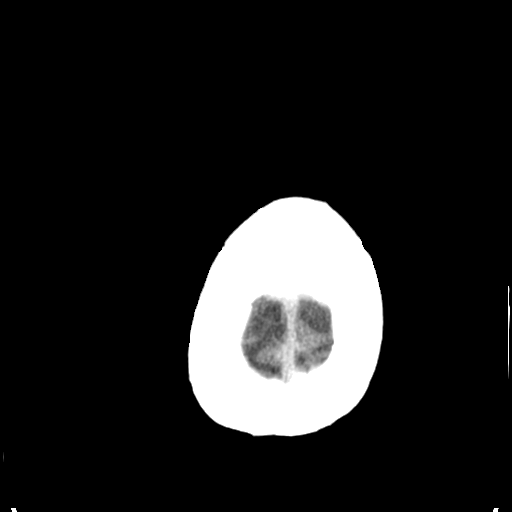
[im 31/33  brain]
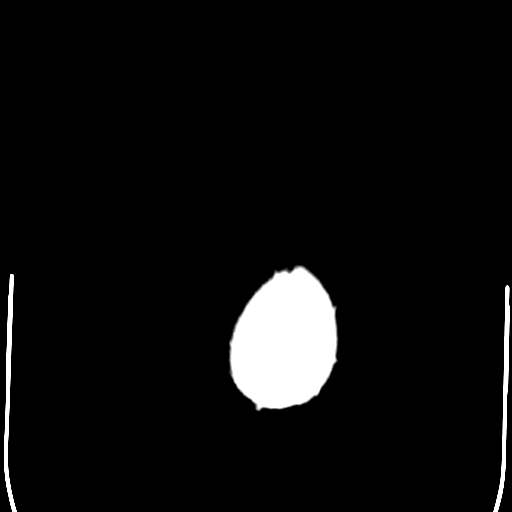

[16 of 30 positions shown; findings below may reference images not displayed]

FINDINGS: Ventricular system normal in size and appearance for age. No mass
lesion. No midline shift. No acute hemorrhage or hematoma. No
extra-axial fluid collections. No evidence of acute infarction.
Previously identified very small white matter strokes on MRI are not
visible on CT.

No focal osseous abnormality involving the skull. Visualized
paranasal sinuses, bilateral mastoid air cells, and bilateral middle
ear cavities well-aerated.
IMPRESSION: No acute intracranial abnormality. Previously identified white
matter strokes on MRI are not visible on CT.

Results were discussed by telephone with the Neurohospitalist at the
time of interpretation on 09/04/2013 at 5959 hr.

## 2016-01-29 DIAGNOSIS — I69351 Hemiplegia and hemiparesis following cerebral infarction affecting right dominant side: Secondary | ICD-10-CM | POA: Diagnosis not present

## 2016-01-29 DIAGNOSIS — F4323 Adjustment disorder with mixed anxiety and depressed mood: Secondary | ICD-10-CM | POA: Diagnosis not present

## 2016-01-29 DIAGNOSIS — R2689 Other abnormalities of gait and mobility: Secondary | ICD-10-CM | POA: Diagnosis not present

## 2016-01-29 DIAGNOSIS — R251 Tremor, unspecified: Secondary | ICD-10-CM | POA: Diagnosis not present

## 2016-01-30 DIAGNOSIS — R251 Tremor, unspecified: Secondary | ICD-10-CM

## 2016-01-30 DIAGNOSIS — I69351 Hemiplegia and hemiparesis following cerebral infarction affecting right dominant side: Secondary | ICD-10-CM

## 2016-01-30 DIAGNOSIS — R2689 Other abnormalities of gait and mobility: Secondary | ICD-10-CM

## 2016-01-30 HISTORY — DX: Other abnormalities of gait and mobility: R26.89

## 2016-01-30 HISTORY — DX: Hemiplegia and hemiparesis following cerebral infarction affecting right dominant side: I69.351

## 2016-01-30 HISTORY — DX: Tremor, unspecified: R25.1

## 2016-02-08 DIAGNOSIS — Z87898 Personal history of other specified conditions: Secondary | ICD-10-CM | POA: Diagnosis not present

## 2016-02-08 DIAGNOSIS — N182 Chronic kidney disease, stage 2 (mild): Secondary | ICD-10-CM | POA: Diagnosis not present

## 2016-02-08 DIAGNOSIS — F419 Anxiety disorder, unspecified: Secondary | ICD-10-CM | POA: Diagnosis not present

## 2016-02-08 DIAGNOSIS — I69351 Hemiplegia and hemiparesis following cerebral infarction affecting right dominant side: Secondary | ICD-10-CM | POA: Diagnosis not present

## 2016-02-08 DIAGNOSIS — I69320 Aphasia following cerebral infarction: Secondary | ICD-10-CM | POA: Diagnosis not present

## 2016-02-08 DIAGNOSIS — M109 Gout, unspecified: Secondary | ICD-10-CM | POA: Diagnosis not present

## 2016-02-08 DIAGNOSIS — I130 Hypertensive heart and chronic kidney disease with heart failure and stage 1 through stage 4 chronic kidney disease, or unspecified chronic kidney disease: Secondary | ICD-10-CM | POA: Diagnosis not present

## 2016-02-08 DIAGNOSIS — J301 Allergic rhinitis due to pollen: Secondary | ICD-10-CM | POA: Diagnosis not present

## 2016-02-08 DIAGNOSIS — E782 Mixed hyperlipidemia: Secondary | ICD-10-CM | POA: Diagnosis not present

## 2016-05-12 DIAGNOSIS — F419 Anxiety disorder, unspecified: Secondary | ICD-10-CM | POA: Diagnosis not present

## 2016-05-12 DIAGNOSIS — M109 Gout, unspecified: Secondary | ICD-10-CM | POA: Diagnosis not present

## 2016-05-12 DIAGNOSIS — N182 Chronic kidney disease, stage 2 (mild): Secondary | ICD-10-CM | POA: Diagnosis not present

## 2016-05-12 DIAGNOSIS — Z87898 Personal history of other specified conditions: Secondary | ICD-10-CM | POA: Diagnosis not present

## 2016-05-12 DIAGNOSIS — I130 Hypertensive heart and chronic kidney disease with heart failure and stage 1 through stage 4 chronic kidney disease, or unspecified chronic kidney disease: Secondary | ICD-10-CM | POA: Diagnosis not present

## 2016-05-12 DIAGNOSIS — J301 Allergic rhinitis due to pollen: Secondary | ICD-10-CM | POA: Diagnosis not present

## 2016-05-12 DIAGNOSIS — E782 Mixed hyperlipidemia: Secondary | ICD-10-CM | POA: Diagnosis not present

## 2016-07-01 DIAGNOSIS — F4323 Adjustment disorder with mixed anxiety and depressed mood: Secondary | ICD-10-CM | POA: Diagnosis not present

## 2016-07-01 DIAGNOSIS — I69351 Hemiplegia and hemiparesis following cerebral infarction affecting right dominant side: Secondary | ICD-10-CM | POA: Diagnosis not present

## 2016-07-01 DIAGNOSIS — R251 Tremor, unspecified: Secondary | ICD-10-CM | POA: Diagnosis not present

## 2016-07-01 DIAGNOSIS — R2689 Other abnormalities of gait and mobility: Secondary | ICD-10-CM | POA: Diagnosis not present

## 2016-08-12 DIAGNOSIS — Z79899 Other long term (current) drug therapy: Secondary | ICD-10-CM | POA: Diagnosis not present

## 2016-08-12 DIAGNOSIS — Z0000 Encounter for general adult medical examination without abnormal findings: Secondary | ICD-10-CM | POA: Diagnosis not present

## 2016-08-12 DIAGNOSIS — N182 Chronic kidney disease, stage 2 (mild): Secondary | ICD-10-CM | POA: Diagnosis not present

## 2016-08-12 DIAGNOSIS — Z125 Encounter for screening for malignant neoplasm of prostate: Secondary | ICD-10-CM | POA: Diagnosis not present

## 2016-08-12 DIAGNOSIS — I130 Hypertensive heart and chronic kidney disease with heart failure and stage 1 through stage 4 chronic kidney disease, or unspecified chronic kidney disease: Secondary | ICD-10-CM | POA: Diagnosis not present

## 2016-08-12 DIAGNOSIS — E669 Obesity, unspecified: Secondary | ICD-10-CM | POA: Diagnosis not present

## 2016-08-12 DIAGNOSIS — Z1212 Encounter for screening for malignant neoplasm of rectum: Secondary | ICD-10-CM | POA: Diagnosis not present

## 2016-08-12 DIAGNOSIS — Z1211 Encounter for screening for malignant neoplasm of colon: Secondary | ICD-10-CM | POA: Diagnosis not present

## 2016-08-12 DIAGNOSIS — R739 Hyperglycemia, unspecified: Secondary | ICD-10-CM | POA: Diagnosis not present

## 2016-08-12 DIAGNOSIS — E785 Hyperlipidemia, unspecified: Secondary | ICD-10-CM | POA: Diagnosis not present

## 2016-08-12 DIAGNOSIS — Z1389 Encounter for screening for other disorder: Secondary | ICD-10-CM | POA: Diagnosis not present

## 2016-08-12 DIAGNOSIS — E790 Hyperuricemia without signs of inflammatory arthritis and tophaceous disease: Secondary | ICD-10-CM | POA: Diagnosis not present

## 2016-09-02 DIAGNOSIS — N182 Chronic kidney disease, stage 2 (mild): Secondary | ICD-10-CM | POA: Diagnosis not present

## 2016-09-02 DIAGNOSIS — M21611 Bunion of right foot: Secondary | ICD-10-CM | POA: Diagnosis not present

## 2016-09-02 DIAGNOSIS — I69351 Hemiplegia and hemiparesis following cerebral infarction affecting right dominant side: Secondary | ICD-10-CM | POA: Diagnosis not present

## 2016-09-02 DIAGNOSIS — F3341 Major depressive disorder, recurrent, in partial remission: Secondary | ICD-10-CM | POA: Diagnosis not present

## 2016-09-02 DIAGNOSIS — I130 Hypertensive heart and chronic kidney disease with heart failure and stage 1 through stage 4 chronic kidney disease, or unspecified chronic kidney disease: Secondary | ICD-10-CM | POA: Diagnosis not present

## 2016-09-02 DIAGNOSIS — F419 Anxiety disorder, unspecified: Secondary | ICD-10-CM | POA: Diagnosis not present

## 2016-09-02 DIAGNOSIS — R739 Hyperglycemia, unspecified: Secondary | ICD-10-CM | POA: Diagnosis not present

## 2016-09-02 DIAGNOSIS — E785 Hyperlipidemia, unspecified: Secondary | ICD-10-CM | POA: Diagnosis not present

## 2016-09-02 DIAGNOSIS — M109 Gout, unspecified: Secondary | ICD-10-CM | POA: Diagnosis not present

## 2016-09-04 DIAGNOSIS — E785 Hyperlipidemia, unspecified: Secondary | ICD-10-CM | POA: Diagnosis not present

## 2016-09-04 DIAGNOSIS — I130 Hypertensive heart and chronic kidney disease with heart failure and stage 1 through stage 4 chronic kidney disease, or unspecified chronic kidney disease: Secondary | ICD-10-CM | POA: Diagnosis not present

## 2016-09-04 DIAGNOSIS — N182 Chronic kidney disease, stage 2 (mild): Secondary | ICD-10-CM | POA: Diagnosis not present

## 2016-09-04 DIAGNOSIS — Z136 Encounter for screening for cardiovascular disorders: Secondary | ICD-10-CM | POA: Diagnosis not present

## 2016-09-04 DIAGNOSIS — Z0000 Encounter for general adult medical examination without abnormal findings: Secondary | ICD-10-CM | POA: Diagnosis not present

## 2016-09-08 DIAGNOSIS — E669 Obesity, unspecified: Secondary | ICD-10-CM | POA: Diagnosis not present

## 2016-09-16 DIAGNOSIS — Z1211 Encounter for screening for malignant neoplasm of colon: Secondary | ICD-10-CM | POA: Diagnosis not present

## 2016-09-19 DIAGNOSIS — M542 Cervicalgia: Secondary | ICD-10-CM | POA: Diagnosis not present

## 2016-09-22 DIAGNOSIS — M542 Cervicalgia: Secondary | ICD-10-CM | POA: Diagnosis not present

## 2016-09-22 DIAGNOSIS — M5080 Other cervical disc disorders, unspecified cervical region: Secondary | ICD-10-CM | POA: Diagnosis not present

## 2016-10-01 DIAGNOSIS — M542 Cervicalgia: Secondary | ICD-10-CM | POA: Diagnosis not present

## 2016-10-02 DIAGNOSIS — M542 Cervicalgia: Secondary | ICD-10-CM | POA: Diagnosis not present

## 2016-10-10 DIAGNOSIS — M542 Cervicalgia: Secondary | ICD-10-CM | POA: Diagnosis not present

## 2016-10-16 DIAGNOSIS — M542 Cervicalgia: Secondary | ICD-10-CM | POA: Diagnosis not present

## 2016-10-21 DIAGNOSIS — M542 Cervicalgia: Secondary | ICD-10-CM | POA: Diagnosis not present

## 2016-10-23 DIAGNOSIS — M542 Cervicalgia: Secondary | ICD-10-CM | POA: Diagnosis not present

## 2016-10-28 DIAGNOSIS — M542 Cervicalgia: Secondary | ICD-10-CM | POA: Diagnosis not present

## 2016-10-30 DIAGNOSIS — M542 Cervicalgia: Secondary | ICD-10-CM | POA: Diagnosis not present

## 2016-11-05 DIAGNOSIS — F419 Anxiety disorder, unspecified: Secondary | ICD-10-CM | POA: Diagnosis not present

## 2016-11-05 DIAGNOSIS — N182 Chronic kidney disease, stage 2 (mild): Secondary | ICD-10-CM | POA: Diagnosis not present

## 2016-11-05 DIAGNOSIS — E785 Hyperlipidemia, unspecified: Secondary | ICD-10-CM | POA: Diagnosis not present

## 2016-11-05 DIAGNOSIS — F3341 Major depressive disorder, recurrent, in partial remission: Secondary | ICD-10-CM | POA: Diagnosis not present

## 2016-11-05 DIAGNOSIS — M109 Gout, unspecified: Secondary | ICD-10-CM | POA: Diagnosis not present

## 2016-11-05 DIAGNOSIS — M542 Cervicalgia: Secondary | ICD-10-CM | POA: Diagnosis not present

## 2016-11-05 DIAGNOSIS — I69351 Hemiplegia and hemiparesis following cerebral infarction affecting right dominant side: Secondary | ICD-10-CM | POA: Diagnosis not present

## 2016-11-05 DIAGNOSIS — I130 Hypertensive heart and chronic kidney disease with heart failure and stage 1 through stage 4 chronic kidney disease, or unspecified chronic kidney disease: Secondary | ICD-10-CM | POA: Diagnosis not present

## 2016-11-06 DIAGNOSIS — M542 Cervicalgia: Secondary | ICD-10-CM | POA: Diagnosis not present

## 2016-11-11 DIAGNOSIS — M542 Cervicalgia: Secondary | ICD-10-CM | POA: Diagnosis not present

## 2016-11-13 DIAGNOSIS — M542 Cervicalgia: Secondary | ICD-10-CM | POA: Diagnosis not present

## 2016-11-18 DIAGNOSIS — M542 Cervicalgia: Secondary | ICD-10-CM | POA: Diagnosis not present

## 2016-11-20 DIAGNOSIS — M542 Cervicalgia: Secondary | ICD-10-CM | POA: Diagnosis not present

## 2016-11-24 DIAGNOSIS — M542 Cervicalgia: Secondary | ICD-10-CM | POA: Diagnosis not present

## 2016-12-02 DIAGNOSIS — M542 Cervicalgia: Secondary | ICD-10-CM | POA: Diagnosis not present

## 2016-12-05 DIAGNOSIS — M542 Cervicalgia: Secondary | ICD-10-CM | POA: Diagnosis not present

## 2016-12-19 DIAGNOSIS — L309 Dermatitis, unspecified: Secondary | ICD-10-CM | POA: Diagnosis not present

## 2016-12-19 DIAGNOSIS — M542 Cervicalgia: Secondary | ICD-10-CM | POA: Diagnosis not present

## 2017-01-05 DIAGNOSIS — L255 Unspecified contact dermatitis due to plants, except food: Secondary | ICD-10-CM | POA: Diagnosis not present

## 2017-04-06 DIAGNOSIS — H1045 Other chronic allergic conjunctivitis: Secondary | ICD-10-CM | POA: Diagnosis not present

## 2017-04-08 DIAGNOSIS — H1013 Acute atopic conjunctivitis, bilateral: Secondary | ICD-10-CM | POA: Diagnosis not present

## 2017-04-18 DIAGNOSIS — R05 Cough: Secondary | ICD-10-CM | POA: Diagnosis not present

## 2017-04-18 DIAGNOSIS — J301 Allergic rhinitis due to pollen: Secondary | ICD-10-CM | POA: Diagnosis not present

## 2017-04-18 DIAGNOSIS — J0100 Acute maxillary sinusitis, unspecified: Secondary | ICD-10-CM | POA: Diagnosis not present

## 2017-06-02 DIAGNOSIS — R404 Transient alteration of awareness: Secondary | ICD-10-CM | POA: Insufficient documentation

## 2017-06-02 DIAGNOSIS — I69351 Hemiplegia and hemiparesis following cerebral infarction affecting right dominant side: Secondary | ICD-10-CM | POA: Diagnosis not present

## 2017-06-02 DIAGNOSIS — R251 Tremor, unspecified: Secondary | ICD-10-CM | POA: Diagnosis not present

## 2017-06-02 DIAGNOSIS — R2689 Other abnormalities of gait and mobility: Secondary | ICD-10-CM | POA: Diagnosis not present

## 2017-06-02 HISTORY — DX: Transient alteration of awareness: R40.4

## 2017-06-17 DIAGNOSIS — F3341 Major depressive disorder, recurrent, in partial remission: Secondary | ICD-10-CM | POA: Diagnosis not present

## 2017-06-17 DIAGNOSIS — N182 Chronic kidney disease, stage 2 (mild): Secondary | ICD-10-CM | POA: Diagnosis not present

## 2017-06-17 DIAGNOSIS — Z79899 Other long term (current) drug therapy: Secondary | ICD-10-CM | POA: Diagnosis not present

## 2017-06-17 DIAGNOSIS — I951 Orthostatic hypotension: Secondary | ICD-10-CM | POA: Diagnosis not present

## 2017-06-17 DIAGNOSIS — R42 Dizziness and giddiness: Secondary | ICD-10-CM | POA: Diagnosis not present

## 2017-06-17 DIAGNOSIS — R0789 Other chest pain: Secondary | ICD-10-CM | POA: Diagnosis not present

## 2017-06-17 DIAGNOSIS — I69351 Hemiplegia and hemiparesis following cerebral infarction affecting right dominant side: Secondary | ICD-10-CM | POA: Diagnosis not present

## 2017-06-17 DIAGNOSIS — I130 Hypertensive heart and chronic kidney disease with heart failure and stage 1 through stage 4 chronic kidney disease, or unspecified chronic kidney disease: Secondary | ICD-10-CM | POA: Diagnosis not present

## 2017-06-17 DIAGNOSIS — Z7729 Contact with and (suspected ) exposure to other hazardous substances: Secondary | ICD-10-CM | POA: Diagnosis not present

## 2017-06-24 DIAGNOSIS — I6782 Cerebral ischemia: Secondary | ICD-10-CM | POA: Diagnosis not present

## 2017-07-09 DIAGNOSIS — R404 Transient alteration of awareness: Secondary | ICD-10-CM | POA: Diagnosis not present

## 2017-07-27 DIAGNOSIS — R404 Transient alteration of awareness: Secondary | ICD-10-CM | POA: Diagnosis not present

## 2017-07-27 DIAGNOSIS — I69351 Hemiplegia and hemiparesis following cerebral infarction affecting right dominant side: Secondary | ICD-10-CM | POA: Diagnosis not present

## 2017-08-04 DIAGNOSIS — R404 Transient alteration of awareness: Secondary | ICD-10-CM | POA: Diagnosis not present

## 2017-08-04 DIAGNOSIS — I69351 Hemiplegia and hemiparesis following cerebral infarction affecting right dominant side: Secondary | ICD-10-CM | POA: Diagnosis not present

## 2017-08-04 DIAGNOSIS — G8190 Hemiplegia, unspecified affecting unspecified side: Secondary | ICD-10-CM | POA: Diagnosis not present

## 2017-08-04 DIAGNOSIS — I6523 Occlusion and stenosis of bilateral carotid arteries: Secondary | ICD-10-CM | POA: Diagnosis not present

## 2017-08-06 DIAGNOSIS — I69351 Hemiplegia and hemiparesis following cerebral infarction affecting right dominant side: Secondary | ICD-10-CM | POA: Diagnosis not present

## 2017-08-06 DIAGNOSIS — R404 Transient alteration of awareness: Secondary | ICD-10-CM | POA: Diagnosis not present

## 2017-08-10 DIAGNOSIS — I69351 Hemiplegia and hemiparesis following cerebral infarction affecting right dominant side: Secondary | ICD-10-CM | POA: Diagnosis not present

## 2017-08-10 DIAGNOSIS — R404 Transient alteration of awareness: Secondary | ICD-10-CM | POA: Diagnosis not present

## 2017-08-31 DIAGNOSIS — F3341 Major depressive disorder, recurrent, in partial remission: Secondary | ICD-10-CM | POA: Diagnosis not present

## 2017-08-31 DIAGNOSIS — Z79899 Other long term (current) drug therapy: Secondary | ICD-10-CM | POA: Diagnosis not present

## 2017-08-31 DIAGNOSIS — N182 Chronic kidney disease, stage 2 (mild): Secondary | ICD-10-CM | POA: Diagnosis not present

## 2017-08-31 DIAGNOSIS — I69351 Hemiplegia and hemiparesis following cerebral infarction affecting right dominant side: Secondary | ICD-10-CM | POA: Diagnosis not present

## 2017-08-31 DIAGNOSIS — J019 Acute sinusitis, unspecified: Secondary | ICD-10-CM | POA: Diagnosis not present

## 2017-08-31 DIAGNOSIS — M109 Gout, unspecified: Secondary | ICD-10-CM | POA: Diagnosis not present

## 2017-08-31 DIAGNOSIS — I130 Hypertensive heart and chronic kidney disease with heart failure and stage 1 through stage 4 chronic kidney disease, or unspecified chronic kidney disease: Secondary | ICD-10-CM | POA: Diagnosis not present

## 2017-08-31 DIAGNOSIS — E785 Hyperlipidemia, unspecified: Secondary | ICD-10-CM | POA: Diagnosis not present

## 2017-09-29 DIAGNOSIS — R251 Tremor, unspecified: Secondary | ICD-10-CM | POA: Diagnosis not present

## 2017-09-29 DIAGNOSIS — F4323 Adjustment disorder with mixed anxiety and depressed mood: Secondary | ICD-10-CM | POA: Diagnosis not present

## 2017-09-29 DIAGNOSIS — I69351 Hemiplegia and hemiparesis following cerebral infarction affecting right dominant side: Secondary | ICD-10-CM | POA: Diagnosis not present

## 2017-09-29 DIAGNOSIS — R2689 Other abnormalities of gait and mobility: Secondary | ICD-10-CM | POA: Diagnosis not present

## 2017-10-08 DIAGNOSIS — H1013 Acute atopic conjunctivitis, bilateral: Secondary | ICD-10-CM | POA: Diagnosis not present

## 2017-10-08 DIAGNOSIS — G453 Amaurosis fugax: Secondary | ICD-10-CM | POA: Diagnosis not present

## 2017-11-30 DIAGNOSIS — M109 Gout, unspecified: Secondary | ICD-10-CM | POA: Diagnosis not present

## 2017-11-30 DIAGNOSIS — I130 Hypertensive heart and chronic kidney disease with heart failure and stage 1 through stage 4 chronic kidney disease, or unspecified chronic kidney disease: Secondary | ICD-10-CM | POA: Diagnosis not present

## 2017-11-30 DIAGNOSIS — Z79899 Other long term (current) drug therapy: Secondary | ICD-10-CM | POA: Diagnosis not present

## 2017-11-30 DIAGNOSIS — F3341 Major depressive disorder, recurrent, in partial remission: Secondary | ICD-10-CM | POA: Diagnosis not present

## 2017-11-30 DIAGNOSIS — E785 Hyperlipidemia, unspecified: Secondary | ICD-10-CM | POA: Diagnosis not present

## 2017-11-30 DIAGNOSIS — N182 Chronic kidney disease, stage 2 (mild): Secondary | ICD-10-CM | POA: Diagnosis not present

## 2017-11-30 DIAGNOSIS — I69351 Hemiplegia and hemiparesis following cerebral infarction affecting right dominant side: Secondary | ICD-10-CM | POA: Diagnosis not present

## 2017-12-17 DIAGNOSIS — L2089 Other atopic dermatitis: Secondary | ICD-10-CM | POA: Diagnosis not present

## 2017-12-25 DIAGNOSIS — L2089 Other atopic dermatitis: Secondary | ICD-10-CM | POA: Diagnosis not present

## 2018-01-21 ENCOUNTER — Encounter

## 2018-01-21 ENCOUNTER — Ambulatory Visit: Payer: Self-pay | Admitting: Allergy and Immunology

## 2018-01-25 DIAGNOSIS — J0100 Acute maxillary sinusitis, unspecified: Secondary | ICD-10-CM | POA: Diagnosis not present

## 2018-03-05 DIAGNOSIS — I130 Hypertensive heart and chronic kidney disease with heart failure and stage 1 through stage 4 chronic kidney disease, or unspecified chronic kidney disease: Secondary | ICD-10-CM | POA: Diagnosis not present

## 2018-03-05 DIAGNOSIS — Z1331 Encounter for screening for depression: Secondary | ICD-10-CM | POA: Diagnosis not present

## 2018-03-05 DIAGNOSIS — E785 Hyperlipidemia, unspecified: Secondary | ICD-10-CM | POA: Diagnosis not present

## 2018-03-05 DIAGNOSIS — M109 Gout, unspecified: Secondary | ICD-10-CM | POA: Diagnosis not present

## 2018-03-05 DIAGNOSIS — R21 Rash and other nonspecific skin eruption: Secondary | ICD-10-CM | POA: Diagnosis not present

## 2018-03-05 DIAGNOSIS — R739 Hyperglycemia, unspecified: Secondary | ICD-10-CM | POA: Diagnosis not present

## 2018-03-05 DIAGNOSIS — I69351 Hemiplegia and hemiparesis following cerebral infarction affecting right dominant side: Secondary | ICD-10-CM | POA: Diagnosis not present

## 2018-03-05 DIAGNOSIS — F419 Anxiety disorder, unspecified: Secondary | ICD-10-CM | POA: Diagnosis not present

## 2018-03-29 DIAGNOSIS — R251 Tremor, unspecified: Secondary | ICD-10-CM | POA: Diagnosis not present

## 2018-03-29 DIAGNOSIS — F4323 Adjustment disorder with mixed anxiety and depressed mood: Secondary | ICD-10-CM | POA: Diagnosis not present

## 2018-03-29 DIAGNOSIS — I69351 Hemiplegia and hemiparesis following cerebral infarction affecting right dominant side: Secondary | ICD-10-CM | POA: Diagnosis not present

## 2018-03-29 DIAGNOSIS — R2689 Other abnormalities of gait and mobility: Secondary | ICD-10-CM | POA: Diagnosis not present

## 2018-03-29 DIAGNOSIS — R404 Transient alteration of awareness: Secondary | ICD-10-CM | POA: Diagnosis not present

## 2018-04-21 DIAGNOSIS — I130 Hypertensive heart and chronic kidney disease with heart failure and stage 1 through stage 4 chronic kidney disease, or unspecified chronic kidney disease: Secondary | ICD-10-CM | POA: Diagnosis not present

## 2018-04-28 DIAGNOSIS — I130 Hypertensive heart and chronic kidney disease with heart failure and stage 1 through stage 4 chronic kidney disease, or unspecified chronic kidney disease: Secondary | ICD-10-CM | POA: Diagnosis not present

## 2018-05-25 DIAGNOSIS — Z1331 Encounter for screening for depression: Secondary | ICD-10-CM | POA: Diagnosis not present

## 2018-05-25 DIAGNOSIS — Z125 Encounter for screening for malignant neoplasm of prostate: Secondary | ICD-10-CM | POA: Diagnosis not present

## 2018-05-25 DIAGNOSIS — Z9181 History of falling: Secondary | ICD-10-CM | POA: Diagnosis not present

## 2018-05-25 DIAGNOSIS — Z6839 Body mass index (BMI) 39.0-39.9, adult: Secondary | ICD-10-CM | POA: Diagnosis not present

## 2018-05-25 DIAGNOSIS — E669 Obesity, unspecified: Secondary | ICD-10-CM | POA: Diagnosis not present

## 2018-05-25 DIAGNOSIS — Z136 Encounter for screening for cardiovascular disorders: Secondary | ICD-10-CM | POA: Diagnosis not present

## 2018-05-25 DIAGNOSIS — E785 Hyperlipidemia, unspecified: Secondary | ICD-10-CM | POA: Diagnosis not present

## 2018-05-25 DIAGNOSIS — Z1339 Encounter for screening examination for other mental health and behavioral disorders: Secondary | ICD-10-CM | POA: Diagnosis not present

## 2018-05-25 DIAGNOSIS — Z0000 Encounter for general adult medical examination without abnormal findings: Secondary | ICD-10-CM | POA: Diagnosis not present

## 2018-05-28 DIAGNOSIS — I1310 Hypertensive heart and chronic kidney disease without heart failure, with stage 1 through stage 4 chronic kidney disease, or unspecified chronic kidney disease: Secondary | ICD-10-CM | POA: Diagnosis not present

## 2018-05-28 DIAGNOSIS — I69351 Hemiplegia and hemiparesis following cerebral infarction affecting right dominant side: Secondary | ICD-10-CM | POA: Diagnosis not present

## 2018-05-28 DIAGNOSIS — L739 Follicular disorder, unspecified: Secondary | ICD-10-CM | POA: Diagnosis not present

## 2018-05-28 DIAGNOSIS — F419 Anxiety disorder, unspecified: Secondary | ICD-10-CM | POA: Diagnosis not present

## 2018-05-28 DIAGNOSIS — Z125 Encounter for screening for malignant neoplasm of prostate: Secondary | ICD-10-CM | POA: Diagnosis not present

## 2018-05-28 DIAGNOSIS — E785 Hyperlipidemia, unspecified: Secondary | ICD-10-CM | POA: Diagnosis not present

## 2018-05-28 DIAGNOSIS — M109 Gout, unspecified: Secondary | ICD-10-CM | POA: Diagnosis not present

## 2018-06-09 ENCOUNTER — Encounter: Payer: Self-pay | Admitting: Cardiology

## 2018-06-22 ENCOUNTER — Ambulatory Visit: Payer: Self-pay | Admitting: Cardiology

## 2018-06-28 DIAGNOSIS — J069 Acute upper respiratory infection, unspecified: Secondary | ICD-10-CM | POA: Diagnosis not present

## 2018-07-08 DIAGNOSIS — J069 Acute upper respiratory infection, unspecified: Secondary | ICD-10-CM | POA: Diagnosis not present

## 2018-08-09 ENCOUNTER — Ambulatory Visit: Payer: Self-pay | Admitting: Cardiology

## 2018-12-02 DIAGNOSIS — G4700 Insomnia, unspecified: Secondary | ICD-10-CM | POA: Diagnosis not present

## 2018-12-02 DIAGNOSIS — L299 Pruritus, unspecified: Secondary | ICD-10-CM | POA: Diagnosis not present

## 2018-12-02 DIAGNOSIS — I10 Essential (primary) hypertension: Secondary | ICD-10-CM | POA: Diagnosis not present

## 2018-12-02 DIAGNOSIS — L570 Actinic keratosis: Secondary | ICD-10-CM | POA: Diagnosis not present

## 2018-12-02 DIAGNOSIS — L300 Nummular dermatitis: Secondary | ICD-10-CM | POA: Diagnosis not present

## 2018-12-02 DIAGNOSIS — M5417 Radiculopathy, lumbosacral region: Secondary | ICD-10-CM | POA: Diagnosis not present

## 2018-12-02 DIAGNOSIS — D485 Neoplasm of uncertain behavior of skin: Secondary | ICD-10-CM | POA: Diagnosis not present

## 2018-12-02 DIAGNOSIS — D500 Iron deficiency anemia secondary to blood loss (chronic): Secondary | ICD-10-CM | POA: Diagnosis not present

## 2018-12-02 DIAGNOSIS — G894 Chronic pain syndrome: Secondary | ICD-10-CM | POA: Diagnosis not present

## 2018-12-02 DIAGNOSIS — M961 Postlaminectomy syndrome, not elsewhere classified: Secondary | ICD-10-CM | POA: Diagnosis not present

## 2018-12-20 DIAGNOSIS — L239 Allergic contact dermatitis, unspecified cause: Secondary | ICD-10-CM | POA: Diagnosis not present

## 2019-01-28 DIAGNOSIS — H43811 Vitreous degeneration, right eye: Secondary | ICD-10-CM | POA: Diagnosis not present

## 2019-01-28 DIAGNOSIS — H11151 Pinguecula, right eye: Secondary | ICD-10-CM | POA: Diagnosis not present

## 2019-02-24 DIAGNOSIS — R251 Tremor, unspecified: Secondary | ICD-10-CM | POA: Diagnosis not present

## 2019-02-24 DIAGNOSIS — R404 Transient alteration of awareness: Secondary | ICD-10-CM | POA: Diagnosis not present

## 2019-02-24 DIAGNOSIS — I69351 Hemiplegia and hemiparesis following cerebral infarction affecting right dominant side: Secondary | ICD-10-CM | POA: Diagnosis not present

## 2019-02-24 DIAGNOSIS — G4733 Obstructive sleep apnea (adult) (pediatric): Secondary | ICD-10-CM | POA: Diagnosis not present

## 2019-02-24 DIAGNOSIS — R2689 Other abnormalities of gait and mobility: Secondary | ICD-10-CM | POA: Diagnosis not present

## 2019-02-24 DIAGNOSIS — F4323 Adjustment disorder with mixed anxiety and depressed mood: Secondary | ICD-10-CM | POA: Diagnosis not present

## 2019-03-10 DIAGNOSIS — H1013 Acute atopic conjunctivitis, bilateral: Secondary | ICD-10-CM | POA: Diagnosis not present

## 2019-03-14 DIAGNOSIS — H1013 Acute atopic conjunctivitis, bilateral: Secondary | ICD-10-CM | POA: Diagnosis not present

## 2019-05-30 DIAGNOSIS — E669 Obesity, unspecified: Secondary | ICD-10-CM | POA: Diagnosis not present

## 2019-05-30 DIAGNOSIS — E785 Hyperlipidemia, unspecified: Secondary | ICD-10-CM | POA: Diagnosis not present

## 2019-05-30 DIAGNOSIS — Z1331 Encounter for screening for depression: Secondary | ICD-10-CM | POA: Diagnosis not present

## 2019-05-30 DIAGNOSIS — Z136 Encounter for screening for cardiovascular disorders: Secondary | ICD-10-CM | POA: Diagnosis not present

## 2019-05-30 DIAGNOSIS — Z9181 History of falling: Secondary | ICD-10-CM | POA: Diagnosis not present

## 2019-05-30 DIAGNOSIS — Z6839 Body mass index (BMI) 39.0-39.9, adult: Secondary | ICD-10-CM | POA: Diagnosis not present

## 2019-05-30 DIAGNOSIS — Z0000 Encounter for general adult medical examination without abnormal findings: Secondary | ICD-10-CM | POA: Diagnosis not present

## 2019-05-30 DIAGNOSIS — Z125 Encounter for screening for malignant neoplasm of prostate: Secondary | ICD-10-CM | POA: Diagnosis not present

## 2019-06-27 DIAGNOSIS — M109 Gout, unspecified: Secondary | ICD-10-CM | POA: Diagnosis not present

## 2019-06-27 DIAGNOSIS — I130 Hypertensive heart and chronic kidney disease with heart failure and stage 1 through stage 4 chronic kidney disease, or unspecified chronic kidney disease: Secondary | ICD-10-CM | POA: Diagnosis not present

## 2019-06-27 DIAGNOSIS — F3341 Major depressive disorder, recurrent, in partial remission: Secondary | ICD-10-CM | POA: Diagnosis not present

## 2019-06-27 DIAGNOSIS — Z2821 Immunization not carried out because of patient refusal: Secondary | ICD-10-CM | POA: Diagnosis not present

## 2019-06-27 DIAGNOSIS — E785 Hyperlipidemia, unspecified: Secondary | ICD-10-CM | POA: Diagnosis not present

## 2019-06-27 DIAGNOSIS — Z125 Encounter for screening for malignant neoplasm of prostate: Secondary | ICD-10-CM | POA: Diagnosis not present

## 2019-06-27 DIAGNOSIS — N182 Chronic kidney disease, stage 2 (mild): Secondary | ICD-10-CM | POA: Diagnosis not present

## 2019-06-27 DIAGNOSIS — Z6841 Body Mass Index (BMI) 40.0 and over, adult: Secondary | ICD-10-CM | POA: Diagnosis not present

## 2019-06-27 DIAGNOSIS — R739 Hyperglycemia, unspecified: Secondary | ICD-10-CM | POA: Diagnosis not present

## 2019-06-27 DIAGNOSIS — I69351 Hemiplegia and hemiparesis following cerebral infarction affecting right dominant side: Secondary | ICD-10-CM | POA: Diagnosis not present

## 2019-08-29 DIAGNOSIS — R55 Syncope and collapse: Secondary | ICD-10-CM | POA: Diagnosis not present

## 2019-08-29 DIAGNOSIS — R251 Tremor, unspecified: Secondary | ICD-10-CM | POA: Diagnosis not present

## 2019-08-29 DIAGNOSIS — R2689 Other abnormalities of gait and mobility: Secondary | ICD-10-CM | POA: Diagnosis not present

## 2019-08-29 DIAGNOSIS — R404 Transient alteration of awareness: Secondary | ICD-10-CM | POA: Diagnosis not present

## 2019-08-29 DIAGNOSIS — I69351 Hemiplegia and hemiparesis following cerebral infarction affecting right dominant side: Secondary | ICD-10-CM | POA: Diagnosis not present

## 2019-09-06 DIAGNOSIS — R55 Syncope and collapse: Secondary | ICD-10-CM | POA: Diagnosis not present

## 2019-09-06 DIAGNOSIS — Z6841 Body Mass Index (BMI) 40.0 and over, adult: Secondary | ICD-10-CM | POA: Diagnosis not present

## 2019-09-06 DIAGNOSIS — I130 Hypertensive heart and chronic kidney disease with heart failure and stage 1 through stage 4 chronic kidney disease, or unspecified chronic kidney disease: Secondary | ICD-10-CM | POA: Diagnosis not present

## 2019-09-06 DIAGNOSIS — R000 Tachycardia, unspecified: Secondary | ICD-10-CM | POA: Diagnosis not present

## 2019-09-06 DIAGNOSIS — E785 Hyperlipidemia, unspecified: Secondary | ICD-10-CM | POA: Diagnosis not present

## 2019-09-06 DIAGNOSIS — N182 Chronic kidney disease, stage 2 (mild): Secondary | ICD-10-CM | POA: Diagnosis not present

## 2019-09-06 DIAGNOSIS — I69351 Hemiplegia and hemiparesis following cerebral infarction affecting right dominant side: Secondary | ICD-10-CM | POA: Diagnosis not present

## 2019-09-06 DIAGNOSIS — R739 Hyperglycemia, unspecified: Secondary | ICD-10-CM | POA: Diagnosis not present

## 2019-09-07 DIAGNOSIS — I639 Cerebral infarction, unspecified: Secondary | ICD-10-CM | POA: Diagnosis not present

## 2019-09-07 DIAGNOSIS — I69351 Hemiplegia and hemiparesis following cerebral infarction affecting right dominant side: Secondary | ICD-10-CM | POA: Diagnosis not present

## 2019-09-07 DIAGNOSIS — R404 Transient alteration of awareness: Secondary | ICD-10-CM | POA: Diagnosis not present

## 2019-09-08 ENCOUNTER — Encounter: Payer: Self-pay | Admitting: Cardiology

## 2019-09-08 ENCOUNTER — Encounter: Payer: Self-pay | Admitting: *Deleted

## 2019-09-08 DIAGNOSIS — R404 Transient alteration of awareness: Secondary | ICD-10-CM | POA: Diagnosis not present

## 2019-09-08 DIAGNOSIS — R55 Syncope and collapse: Secondary | ICD-10-CM | POA: Diagnosis not present

## 2019-09-08 DIAGNOSIS — I69351 Hemiplegia and hemiparesis following cerebral infarction affecting right dominant side: Secondary | ICD-10-CM | POA: Diagnosis not present

## 2019-09-08 DIAGNOSIS — I668 Occlusion and stenosis of other cerebral arteries: Secondary | ICD-10-CM | POA: Diagnosis not present

## 2019-09-09 ENCOUNTER — Encounter: Payer: Self-pay | Admitting: *Deleted

## 2019-09-09 ENCOUNTER — Ambulatory Visit (INDEPENDENT_AMBULATORY_CARE_PROVIDER_SITE_OTHER): Payer: PPO

## 2019-09-09 ENCOUNTER — Encounter: Payer: Self-pay | Admitting: Cardiology

## 2019-09-09 ENCOUNTER — Other Ambulatory Visit: Payer: Self-pay

## 2019-09-09 ENCOUNTER — Ambulatory Visit (INDEPENDENT_AMBULATORY_CARE_PROVIDER_SITE_OTHER): Payer: PPO | Admitting: Cardiology

## 2019-09-09 VITALS — BP 140/80 | Ht 70.0 in | Wt 279.0 lb

## 2019-09-09 DIAGNOSIS — R011 Cardiac murmur, unspecified: Secondary | ICD-10-CM | POA: Insufficient documentation

## 2019-09-09 DIAGNOSIS — Z8673 Personal history of transient ischemic attack (TIA), and cerebral infarction without residual deficits: Secondary | ICD-10-CM

## 2019-09-09 DIAGNOSIS — R002 Palpitations: Secondary | ICD-10-CM

## 2019-09-09 DIAGNOSIS — I10 Essential (primary) hypertension: Secondary | ICD-10-CM

## 2019-09-09 HISTORY — DX: Essential (primary) hypertension: I10

## 2019-09-09 HISTORY — DX: Palpitations: R00.2

## 2019-09-09 HISTORY — DX: Cardiac murmur, unspecified: R01.1

## 2019-09-09 HISTORY — DX: Personal history of transient ischemic attack (TIA), and cerebral infarction without residual deficits: Z86.73

## 2019-09-09 NOTE — Progress Notes (Signed)
Cardiology Office Note:    Date:  09/09/2019   ID:  Ricardo Murphy, DOB January 29, 1956, MRN [id number]  PCP:  Nicoletta Dress, MD  Cardiologist:  Jenean Lindau, MD   Referring MD: Nicoletta Dress, MD    ASSESSMENT:    1. Palpitations   2. Essential hypertension   3. History of stroke   4. Cardiac murmur    PLAN:    In order of problems listed above:  1. Primary prevention stressed with the patient.  Importance of compliance with diet and medication stressed and he vocalized understanding.  Diet was discussed for obesity and weight reduction was stressed. 2. Palpitations: I will try to get a copy of blood work from primary care physician and see if TSH was done.  We will do a 2-week ZIO monitor.  He complains of some allergies to adhesives and we will have to see whether he can tolerate the ZIO. 3. Essential hypertension: Blood pressure stable.  Recently primary care physician has added blood pressure medications.  Echocardiogram will be done to assess murmur heard on auscultation. 4. Patient will be seen in follow-up appointment in 3 weeks or earlier if the patient has any concerns.    Medication Adjustments/Labs and Tests Ordered: Current medicines are reviewed at length with the patient today.  Concerns regarding medicines are outlined above.  Orders Placed This Encounter  Procedures  . LONG TERM MONITOR (3-14 DAYS)  . EKG 12-Lead  . ECHOCARDIOGRAM COMPLETE   No orders of the defined types were placed in this encounter.    History of Present Illness:    Ricardo Murphy is a 64 y.o. male who is being seen today for the evaluation of palpitations at the request of Nicoletta Dress, MD.  He has history of essential hypertension and dyslipidemia.  He mentions to me that he occasionally feels palpitations.  No orthopnea or PND.  He tells me that he is an active gentleman.  He does not exercise on a regular basis.  He is overweight.  He denies any chest pain at all.  At the  time of my evaluation, the patient is alert awake oriented and in no distress.  Past Medical History:  Diagnosis Date  . Accelerated essential hypertension 05/09/2014  . Adjustment disorder with mixed anxiety and depressed mood 11/28/2015  . Adverse reaction to antihyperlipidemic drug 05/09/2014  . Agoraphobia with panic disorder 05/09/2014  . Altered level of consciousness 06/02/2017  . Anxiety disorder 05/09/2014  . 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 Arthralgia of multiple joints 05/09/2014  . Ataxia, late effect of cerebrovascular disease 06/14/2013  . Blurred vision 05/09/2014  . BP (high blood pressure) 05/09/2014  . Breath shortness 05/09/2014  . Bunion   . Cerebral vascular accident ([place]) 05/09/2014  . CVA (cerebral infarction) 08/05/2012  . CVA, old, hemiparesis ([place]) 05/09/2014  . Depression, major, recurrent ([place]) 05/09/2014  . Dermatitis due to drug reaction 05/09/2014  . Disorder of muscle 05/09/2014  . Drug, medication, or biological substance adverse effect, late effect 05/09/2014  . Dyslipidemia   . Elevated blood uric acid level 05/09/2014  . Encounter for long-term (current) use of other medications 05/09/2014  . Erectile dysfunction   . Extreme obesity 05/09/2014  . Fatigue 05/09/2014  . Gout 05/09/2014  . Hemiparesis affecting right side as late effect of stroke ([place]) 01/30/2016  . HLD (hyperlipidemia) 05/09/2014  . HTN (hypertension) 08/05/2012  . Hyperglycemia   . Imbalance 01/30/2016  . Itch  05/09/2014  . Male erectile disorder   . Multiple allergies   . Pneumonia    4 weeks ago  . Seizure disorder ([place]) 05/09/2014  . Sinus infection    a couple of months ago  . Special screening for malignant neoplasm of prostate 05/09/2014  . Special screening for malignant neoplasms, colon 05/09/2014  . Stroke [facility])    Dec. 9, 2013  . Tremor 01/30/2016    Past Surgical History:  Procedure Laterality Date  . ADENOIDECTOMY    . APPENDECTOMY    . KNEE  ARTHROSCOPY     left  . SHOULDER ARTHROSCOPY WITH BICEPS TENDON REPAIR     right  . TONSILLECTOMY      Current Medications: Current Meds  Medication Sig  . aspirin EC 81 MG tablet Take 81 mg by mouth daily.  . colchicine 0.6 MG tablet Take 0.6 mg by mouth daily.  . cyanocobalamin 100 MCG tablet Take 100 mcg by mouth daily.  . fluorometholone (FML) 0.1 % ophthalmic suspension   . hydrOXYzine (ATARAX/VISTARIL) 10 MG tablet Take 10 mg by mouth 3 (three) times daily as needed for anxiety.  . mupirocin ointment (BACTROBAN) 2 % Place 1 application into the nose 2 (two) times daily.  Marland Kitchen olmesartan (BENICAR) 20 MG tablet Take 20 mg by mouth daily.  Marland Kitchen triamcinolone ointment (KENALOG) 0.1 % Apply 1 application topically 2 (two) times daily.     Allergies:   Lisinopril, Rosuvastatin calcium, Statins, Atorvastatin, Belviq [lorcaserin], Zocor [simvastatin], Latex, and Pravastatin   Social History   Socioeconomic History  . Marital status: Married    Spouse name: Not on file  . Number of children: Not on file  . Years of education: Not on file  . Highest education level: Not on file  Occupational History  . Not on file  Tobacco Use  . Smoking status: Never Smoker  . Smokeless tobacco: Never Used  Substance and Sexual Activity  . Alcohol use: Yes    Comment: Occasional beer  . Drug use: No  . Sexual activity: Not on file  Other Topics Concern  . Not on file  Social History Narrative  . Not on file   Social Determinants of Health   Financial Resource Strain:   . Difficulty of Paying Living Expenses: Not on file  Food Insecurity:   . Worried About Charity fundraiser in the Last Year: Not on file  . Ran Out of Food in the Last Year: Not on file  Transportation Needs:   . Lack of Transportation (Medical): Not on file  . Lack of Transportation (Non-Medical): Not on file  Physical Activity:   . Days of Exercise per Week: Not on file  . Minutes of Exercise per Session: Not on file    Stress:   . Feeling of Stress : Not on file  Social Connections:   . Frequency of Communication with Friends and Family: Not on file  . Frequency of Social Gatherings with Friends and Family: Not on file  . Attends Religious Services: Not on file  . Active Member of Clubs or Organizations: Not on file  . Attends Archivist Meetings: Not on file  . Marital Status: Not on file     Family History: The patient's family history includes Alcohol abuse in his father; Arthritis in his mother; Breast cancer in his mother; CVA in his father and sister; Cancer - Other in his mother; Diabetes in his mother; Heart attack in his father; Hyperlipidemia  in his father; Hypertension in his father; Valvular heart disease in his mother.  ROS:   Please see the history of present illness.    All other systems reviewed and are negative.  EKGs/Labs/Other Studies Reviewed:    The following studies were reviewed today: EKG reveals sinus rhythm and PACs and nonspecific ST-T changes   Recent Labs: No results found for requested labs within last 8760 hours.  Recent Lipid Panel    Component Value Date/Time   CHOL 264 (H) 09/05/2013 0225   TRIG 144 09/05/2013 0225   HDL 46 09/05/2013 0225   CHOLHDL 5.7 09/05/2013 0225   VLDL 29 09/05/2013 0225   LDLCALC 189 (H) 09/05/2013 0225    Physical Exam:    VS:  BP 140/80 (BP Location: Left Arm, Patient Position: Sitting, Cuff Size: Large)   Ht 5\' 10"  (1.778 m)   Wt 279 lb (126.6 kg)   SpO2 95%   BMI 40.03 kg/m     Wt Readings from Last 3 Encounters:  09/09/19 279 lb (126.6 kg)  09/06/19 280 lb (127 kg)  05/09/14 267 lb (121.1 kg)     GEN: Patient is in no acute distress HEENT: Normal NECK: No JVD; No carotid bruits LYMPHATICS: No lymphadenopathy CARDIAC: S1 S2 regular, 2/6 systolic murmur at the apex. RESPIRATORY:  Clear to auscultation without rales, wheezing or rhonchi  ABDOMEN: Soft, non-tender, non-distended MUSCULOSKELETAL:  No  edema; No deformity  SKIN: Warm and dry NEUROLOGIC:  Alert and oriented x 3 PSYCHIATRIC:  Normal affect    Signed, Jenean Lindau, MD  09/09/2019 9:34 AM    [place]

## 2019-09-09 NOTE — Patient Instructions (Signed)
Medication Instructions:  Your physician recommends that you continue on your current medications as directed. Please refer to the Current Medication list given to you today.   *If you need a refill on your cardiac medications before your next appointment, please call your pharmacy*  Lab Work: None If you have labs (blood work) drawn today and your tests are completely normal, you will receive your results only by: Marland Kitchen MyChart Message (if you have MyChart) OR . A paper copy in the mail If you have any lab test that is abnormal or we need to change your treatment, we will call you to review the results.  Testing/Procedures: A zio monitor was ordered today. It will remain on for 14 days. You will then return monitor and event diary in provided box. It takes 1-2 weeks for report to be downloaded and returned to [place]. We will call you with the results. If monitor falls off or has orange flashing light, please call Zio for further instructions.   Your physician has requested that you have an echocardiogram. Echocardiography is a painless test that uses sound waves to create images of your heart. It provides your doctor with information about the size and shape of your heart and how well your heart's chambers and valves are working. This procedure takes approximately one hour. There are no restrictions for this procedure.    Follow-Up: At [facility], you and your health needs are our priority.  As part of our continuing mission to provide you with exceptional heart care, we have created designated Provider Care Teams.  These Care Teams include your primary Cardiologist (physician) and Advanced Practice Providers (APPs -  Physician Assistants and Nurse Practitioners) who all work together to provide you with the care you need, when you need it.  Your next appointment:   3 week(s)  The format for your next appointment:   In Person  Provider:   Jyl Heinz, MD  Other Instructions

## 2019-09-19 DIAGNOSIS — R404 Transient alteration of awareness: Secondary | ICD-10-CM | POA: Diagnosis not present

## 2019-09-19 DIAGNOSIS — R55 Syncope and collapse: Secondary | ICD-10-CM | POA: Diagnosis not present

## 2019-09-21 DIAGNOSIS — R002 Palpitations: Secondary | ICD-10-CM | POA: Diagnosis not present

## 2019-09-27 DIAGNOSIS — Z20822 Contact with and (suspected) exposure to covid-19: Secondary | ICD-10-CM | POA: Diagnosis not present

## 2019-09-28 DIAGNOSIS — Z20822 Contact with and (suspected) exposure to covid-19: Secondary | ICD-10-CM | POA: Diagnosis not present

## 2019-09-28 DIAGNOSIS — R6889 Other general symptoms and signs: Secondary | ICD-10-CM | POA: Diagnosis not present

## 2019-09-29 ENCOUNTER — Encounter: Payer: Self-pay | Admitting: *Deleted

## 2019-09-30 ENCOUNTER — Ambulatory Visit: Payer: PPO | Admitting: Cardiology

## 2019-10-31 ENCOUNTER — Other Ambulatory Visit: Payer: Self-pay

## 2019-10-31 ENCOUNTER — Ambulatory Visit (INDEPENDENT_AMBULATORY_CARE_PROVIDER_SITE_OTHER): Payer: PPO

## 2019-10-31 DIAGNOSIS — R002 Palpitations: Secondary | ICD-10-CM | POA: Diagnosis not present

## 2019-10-31 DIAGNOSIS — I10 Essential (primary) hypertension: Secondary | ICD-10-CM | POA: Diagnosis not present

## 2019-10-31 NOTE — Progress Notes (Signed)
Complete echocardiogram has been performed.  Jimmy Ross Hefferan RDCS, RVT 

## 2019-11-01 ENCOUNTER — Telehealth: Payer: Self-pay

## 2019-11-01 DIAGNOSIS — R404 Transient alteration of awareness: Secondary | ICD-10-CM | POA: Diagnosis not present

## 2019-11-01 DIAGNOSIS — I69351 Hemiplegia and hemiparesis following cerebral infarction affecting right dominant side: Secondary | ICD-10-CM | POA: Diagnosis not present

## 2019-11-01 NOTE — Telephone Encounter (Signed)
Results reviewed with pt as per Dr. Revankar's note.  Pt verbalized understanding and had no additional questions.   

## 2019-12-09 ENCOUNTER — Ambulatory Visit: Payer: PPO | Admitting: Cardiology

## 2019-12-12 DIAGNOSIS — Z6839 Body mass index (BMI) 39.0-39.9, adult: Secondary | ICD-10-CM | POA: Diagnosis not present

## 2019-12-12 DIAGNOSIS — L0390 Cellulitis, unspecified: Secondary | ICD-10-CM | POA: Diagnosis not present

## 2019-12-12 DIAGNOSIS — R21 Rash and other nonspecific skin eruption: Secondary | ICD-10-CM | POA: Diagnosis not present

## 2019-12-12 DIAGNOSIS — L309 Dermatitis, unspecified: Secondary | ICD-10-CM | POA: Diagnosis not present

## 2020-01-09 ENCOUNTER — Ambulatory Visit: Payer: PPO | Admitting: Cardiology

## 2020-02-08 DIAGNOSIS — I69351 Hemiplegia and hemiparesis following cerebral infarction affecting right dominant side: Secondary | ICD-10-CM | POA: Diagnosis not present

## 2020-02-08 DIAGNOSIS — R42 Dizziness and giddiness: Secondary | ICD-10-CM | POA: Diagnosis not present

## 2020-02-08 DIAGNOSIS — L309 Dermatitis, unspecified: Secondary | ICD-10-CM | POA: Diagnosis not present

## 2020-02-08 DIAGNOSIS — Z6839 Body mass index (BMI) 39.0-39.9, adult: Secondary | ICD-10-CM | POA: Diagnosis not present

## 2020-02-08 DIAGNOSIS — M109 Gout, unspecified: Secondary | ICD-10-CM | POA: Diagnosis not present

## 2020-02-08 DIAGNOSIS — R739 Hyperglycemia, unspecified: Secondary | ICD-10-CM | POA: Diagnosis not present

## 2020-02-08 DIAGNOSIS — I130 Hypertensive heart and chronic kidney disease with heart failure and stage 1 through stage 4 chronic kidney disease, or unspecified chronic kidney disease: Secondary | ICD-10-CM | POA: Diagnosis not present

## 2020-02-08 DIAGNOSIS — N182 Chronic kidney disease, stage 2 (mild): Secondary | ICD-10-CM | POA: Diagnosis not present

## 2020-02-08 DIAGNOSIS — F325 Major depressive disorder, single episode, in full remission: Secondary | ICD-10-CM | POA: Diagnosis not present

## 2020-02-08 DIAGNOSIS — E785 Hyperlipidemia, unspecified: Secondary | ICD-10-CM | POA: Diagnosis not present

## 2020-05-02 DIAGNOSIS — M109 Gout, unspecified: Secondary | ICD-10-CM | POA: Diagnosis not present

## 2020-05-02 DIAGNOSIS — I69351 Hemiplegia and hemiparesis following cerebral infarction affecting right dominant side: Secondary | ICD-10-CM | POA: Diagnosis not present

## 2020-05-02 DIAGNOSIS — E785 Hyperlipidemia, unspecified: Secondary | ICD-10-CM | POA: Diagnosis not present

## 2020-05-02 DIAGNOSIS — I130 Hypertensive heart and chronic kidney disease with heart failure and stage 1 through stage 4 chronic kidney disease, or unspecified chronic kidney disease: Secondary | ICD-10-CM | POA: Diagnosis not present

## 2020-05-02 DIAGNOSIS — F325 Major depressive disorder, single episode, in full remission: Secondary | ICD-10-CM | POA: Diagnosis not present

## 2020-05-02 DIAGNOSIS — R739 Hyperglycemia, unspecified: Secondary | ICD-10-CM | POA: Diagnosis not present

## 2020-05-02 DIAGNOSIS — N182 Chronic kidney disease, stage 2 (mild): Secondary | ICD-10-CM | POA: Diagnosis not present

## 2020-05-02 DIAGNOSIS — R21 Rash and other nonspecific skin eruption: Secondary | ICD-10-CM | POA: Diagnosis not present

## 2020-05-02 DIAGNOSIS — Z6838 Body mass index (BMI) 38.0-38.9, adult: Secondary | ICD-10-CM | POA: Diagnosis not present

## 2020-05-03 DIAGNOSIS — R404 Transient alteration of awareness: Secondary | ICD-10-CM | POA: Diagnosis not present

## 2020-06-13 DIAGNOSIS — I10 Essential (primary) hypertension: Secondary | ICD-10-CM | POA: Diagnosis not present

## 2020-06-13 DIAGNOSIS — W010XXA Fall on same level from slipping, tripping and stumbling without subsequent striking against object, initial encounter: Secondary | ICD-10-CM | POA: Diagnosis not present

## 2020-06-13 DIAGNOSIS — Z79899 Other long term (current) drug therapy: Secondary | ICD-10-CM | POA: Diagnosis not present

## 2020-06-13 DIAGNOSIS — S8002XA Contusion of left knee, initial encounter: Secondary | ICD-10-CM | POA: Diagnosis not present

## 2020-06-13 DIAGNOSIS — M25562 Pain in left knee: Secondary | ICD-10-CM | POA: Diagnosis not present

## 2020-06-13 DIAGNOSIS — Z8673 Personal history of transient ischemic attack (TIA), and cerebral infarction without residual deficits: Secondary | ICD-10-CM | POA: Diagnosis not present

## 2020-06-13 DIAGNOSIS — Z885 Allergy status to narcotic agent status: Secondary | ICD-10-CM | POA: Diagnosis not present

## 2020-06-13 DIAGNOSIS — R569 Unspecified convulsions: Secondary | ICD-10-CM | POA: Diagnosis not present

## 2020-06-13 DIAGNOSIS — M25512 Pain in left shoulder: Secondary | ICD-10-CM | POA: Diagnosis not present

## 2020-06-13 DIAGNOSIS — S40012A Contusion of left shoulder, initial encounter: Secondary | ICD-10-CM | POA: Diagnosis not present

## 2020-06-13 DIAGNOSIS — M109 Gout, unspecified: Secondary | ICD-10-CM | POA: Diagnosis not present

## 2020-06-19 DIAGNOSIS — M25561 Pain in right knee: Secondary | ICD-10-CM | POA: Diagnosis not present

## 2020-06-19 DIAGNOSIS — M25562 Pain in left knee: Secondary | ICD-10-CM | POA: Diagnosis not present

## 2020-06-19 DIAGNOSIS — M25512 Pain in left shoulder: Secondary | ICD-10-CM | POA: Diagnosis not present

## 2020-07-11 DIAGNOSIS — M25562 Pain in left knee: Secondary | ICD-10-CM | POA: Diagnosis not present

## 2020-07-11 DIAGNOSIS — M25512 Pain in left shoulder: Secondary | ICD-10-CM | POA: Diagnosis not present

## 2020-07-17 DIAGNOSIS — M84362A Stress fracture, left tibia, initial encounter for fracture: Secondary | ICD-10-CM | POA: Diagnosis not present

## 2020-07-17 DIAGNOSIS — M75122 Complete rotator cuff tear or rupture of left shoulder, not specified as traumatic: Secondary | ICD-10-CM | POA: Diagnosis not present

## 2020-07-17 DIAGNOSIS — M25562 Pain in left knee: Secondary | ICD-10-CM | POA: Diagnosis not present

## 2020-07-17 DIAGNOSIS — M25512 Pain in left shoulder: Secondary | ICD-10-CM | POA: Diagnosis not present

## 2020-07-24 DIAGNOSIS — I69351 Hemiplegia and hemiparesis following cerebral infarction affecting right dominant side: Secondary | ICD-10-CM | POA: Diagnosis not present

## 2020-07-24 DIAGNOSIS — R55 Syncope and collapse: Secondary | ICD-10-CM | POA: Diagnosis not present

## 2020-07-24 DIAGNOSIS — R404 Transient alteration of awareness: Secondary | ICD-10-CM | POA: Diagnosis not present

## 2020-07-26 DIAGNOSIS — E785 Hyperlipidemia, unspecified: Secondary | ICD-10-CM | POA: Diagnosis not present

## 2020-07-26 DIAGNOSIS — E669 Obesity, unspecified: Secondary | ICD-10-CM | POA: Diagnosis not present

## 2020-07-26 DIAGNOSIS — Z0000 Encounter for general adult medical examination without abnormal findings: Secondary | ICD-10-CM | POA: Diagnosis not present

## 2020-07-26 DIAGNOSIS — Z1331 Encounter for screening for depression: Secondary | ICD-10-CM | POA: Diagnosis not present

## 2020-07-26 DIAGNOSIS — Z9181 History of falling: Secondary | ICD-10-CM | POA: Diagnosis not present

## 2020-08-06 DIAGNOSIS — G8918 Other acute postprocedural pain: Secondary | ICD-10-CM | POA: Diagnosis not present

## 2020-08-06 DIAGNOSIS — S46012A Strain of muscle(s) and tendon(s) of the rotator cuff of left shoulder, initial encounter: Secondary | ICD-10-CM | POA: Diagnosis not present

## 2020-08-06 DIAGNOSIS — X58XXXA Exposure to other specified factors, initial encounter: Secondary | ICD-10-CM | POA: Diagnosis not present

## 2020-08-06 DIAGNOSIS — Y999 Unspecified external cause status: Secondary | ICD-10-CM | POA: Diagnosis not present

## 2020-08-06 DIAGNOSIS — S43432A Superior glenoid labrum lesion of left shoulder, initial encounter: Secondary | ICD-10-CM | POA: Diagnosis not present

## 2020-08-09 DIAGNOSIS — M75102 Unspecified rotator cuff tear or rupture of left shoulder, not specified as traumatic: Secondary | ICD-10-CM | POA: Diagnosis not present

## 2020-08-09 DIAGNOSIS — M62522 Muscle wasting and atrophy, not elsewhere classified, left upper arm: Secondary | ICD-10-CM | POA: Diagnosis not present

## 2020-08-09 DIAGNOSIS — M25512 Pain in left shoulder: Secondary | ICD-10-CM | POA: Diagnosis not present

## 2020-08-13 DIAGNOSIS — M62522 Muscle wasting and atrophy, not elsewhere classified, left upper arm: Secondary | ICD-10-CM | POA: Diagnosis not present

## 2020-08-13 DIAGNOSIS — M25512 Pain in left shoulder: Secondary | ICD-10-CM | POA: Diagnosis not present

## 2020-08-13 DIAGNOSIS — M75102 Unspecified rotator cuff tear or rupture of left shoulder, not specified as traumatic: Secondary | ICD-10-CM | POA: Diagnosis not present

## 2020-08-16 DIAGNOSIS — M75102 Unspecified rotator cuff tear or rupture of left shoulder, not specified as traumatic: Secondary | ICD-10-CM | POA: Diagnosis not present

## 2020-08-16 DIAGNOSIS — M25512 Pain in left shoulder: Secondary | ICD-10-CM | POA: Diagnosis not present

## 2020-08-16 DIAGNOSIS — M62522 Muscle wasting and atrophy, not elsewhere classified, left upper arm: Secondary | ICD-10-CM | POA: Diagnosis not present

## 2020-08-21 DIAGNOSIS — M62522 Muscle wasting and atrophy, not elsewhere classified, left upper arm: Secondary | ICD-10-CM | POA: Diagnosis not present

## 2020-08-21 DIAGNOSIS — M75102 Unspecified rotator cuff tear or rupture of left shoulder, not specified as traumatic: Secondary | ICD-10-CM | POA: Diagnosis not present

## 2020-08-21 DIAGNOSIS — M25512 Pain in left shoulder: Secondary | ICD-10-CM | POA: Diagnosis not present

## 2020-08-23 DIAGNOSIS — M62522 Muscle wasting and atrophy, not elsewhere classified, left upper arm: Secondary | ICD-10-CM | POA: Diagnosis not present

## 2020-08-23 DIAGNOSIS — M25512 Pain in left shoulder: Secondary | ICD-10-CM | POA: Diagnosis not present

## 2020-08-23 DIAGNOSIS — M75102 Unspecified rotator cuff tear or rupture of left shoulder, not specified as traumatic: Secondary | ICD-10-CM | POA: Diagnosis not present

## 2020-08-28 DIAGNOSIS — M75102 Unspecified rotator cuff tear or rupture of left shoulder, not specified as traumatic: Secondary | ICD-10-CM | POA: Diagnosis not present

## 2020-08-28 DIAGNOSIS — M25512 Pain in left shoulder: Secondary | ICD-10-CM | POA: Diagnosis not present

## 2020-08-28 DIAGNOSIS — M62522 Muscle wasting and atrophy, not elsewhere classified, left upper arm: Secondary | ICD-10-CM | POA: Diagnosis not present

## 2020-09-04 DIAGNOSIS — M62522 Muscle wasting and atrophy, not elsewhere classified, left upper arm: Secondary | ICD-10-CM | POA: Diagnosis not present

## 2020-09-04 DIAGNOSIS — M25512 Pain in left shoulder: Secondary | ICD-10-CM | POA: Diagnosis not present

## 2020-09-04 DIAGNOSIS — M75102 Unspecified rotator cuff tear or rupture of left shoulder, not specified as traumatic: Secondary | ICD-10-CM | POA: Diagnosis not present

## 2020-09-13 DIAGNOSIS — M62522 Muscle wasting and atrophy, not elsewhere classified, left upper arm: Secondary | ICD-10-CM | POA: Diagnosis not present

## 2020-09-13 DIAGNOSIS — M75102 Unspecified rotator cuff tear or rupture of left shoulder, not specified as traumatic: Secondary | ICD-10-CM | POA: Diagnosis not present

## 2020-09-13 DIAGNOSIS — M25512 Pain in left shoulder: Secondary | ICD-10-CM | POA: Diagnosis not present

## 2020-09-17 DIAGNOSIS — M75102 Unspecified rotator cuff tear or rupture of left shoulder, not specified as traumatic: Secondary | ICD-10-CM | POA: Diagnosis not present

## 2020-09-17 DIAGNOSIS — M25512 Pain in left shoulder: Secondary | ICD-10-CM | POA: Diagnosis not present

## 2020-09-17 DIAGNOSIS — M62522 Muscle wasting and atrophy, not elsewhere classified, left upper arm: Secondary | ICD-10-CM | POA: Diagnosis not present

## 2020-09-20 DIAGNOSIS — M62522 Muscle wasting and atrophy, not elsewhere classified, left upper arm: Secondary | ICD-10-CM | POA: Diagnosis not present

## 2020-09-20 DIAGNOSIS — M25512 Pain in left shoulder: Secondary | ICD-10-CM | POA: Diagnosis not present

## 2020-09-20 DIAGNOSIS — M75102 Unspecified rotator cuff tear or rupture of left shoulder, not specified as traumatic: Secondary | ICD-10-CM | POA: Diagnosis not present

## 2020-09-25 DIAGNOSIS — M25512 Pain in left shoulder: Secondary | ICD-10-CM | POA: Diagnosis not present

## 2020-09-25 DIAGNOSIS — M62522 Muscle wasting and atrophy, not elsewhere classified, left upper arm: Secondary | ICD-10-CM | POA: Diagnosis not present

## 2020-09-25 DIAGNOSIS — M75102 Unspecified rotator cuff tear or rupture of left shoulder, not specified as traumatic: Secondary | ICD-10-CM | POA: Diagnosis not present

## 2020-09-27 DIAGNOSIS — M25512 Pain in left shoulder: Secondary | ICD-10-CM | POA: Diagnosis not present

## 2020-09-27 DIAGNOSIS — M75102 Unspecified rotator cuff tear or rupture of left shoulder, not specified as traumatic: Secondary | ICD-10-CM | POA: Diagnosis not present

## 2020-09-27 DIAGNOSIS — M62522 Muscle wasting and atrophy, not elsewhere classified, left upper arm: Secondary | ICD-10-CM | POA: Diagnosis not present

## 2020-10-02 DIAGNOSIS — M75102 Unspecified rotator cuff tear or rupture of left shoulder, not specified as traumatic: Secondary | ICD-10-CM | POA: Diagnosis not present

## 2020-10-02 DIAGNOSIS — M62522 Muscle wasting and atrophy, not elsewhere classified, left upper arm: Secondary | ICD-10-CM | POA: Diagnosis not present

## 2020-10-02 DIAGNOSIS — M25512 Pain in left shoulder: Secondary | ICD-10-CM | POA: Diagnosis not present

## 2020-10-03 DIAGNOSIS — R404 Transient alteration of awareness: Secondary | ICD-10-CM | POA: Diagnosis not present

## 2020-10-03 DIAGNOSIS — R410 Disorientation, unspecified: Secondary | ICD-10-CM | POA: Diagnosis not present

## 2020-10-03 DIAGNOSIS — T426X5A Adverse effect of other antiepileptic and sedative-hypnotic drugs, initial encounter: Secondary | ICD-10-CM | POA: Diagnosis not present

## 2020-10-04 DIAGNOSIS — I69351 Hemiplegia and hemiparesis following cerebral infarction affecting right dominant side: Secondary | ICD-10-CM | POA: Diagnosis not present

## 2020-10-04 DIAGNOSIS — N182 Chronic kidney disease, stage 2 (mild): Secondary | ICD-10-CM | POA: Diagnosis not present

## 2020-10-04 DIAGNOSIS — M109 Gout, unspecified: Secondary | ICD-10-CM | POA: Diagnosis not present

## 2020-10-04 DIAGNOSIS — Z8673 Personal history of transient ischemic attack (TIA), and cerebral infarction without residual deficits: Secondary | ICD-10-CM | POA: Diagnosis not present

## 2020-10-04 DIAGNOSIS — M62522 Muscle wasting and atrophy, not elsewhere classified, left upper arm: Secondary | ICD-10-CM | POA: Diagnosis not present

## 2020-10-04 DIAGNOSIS — M75102 Unspecified rotator cuff tear or rupture of left shoulder, not specified as traumatic: Secondary | ICD-10-CM | POA: Diagnosis not present

## 2020-10-04 DIAGNOSIS — E785 Hyperlipidemia, unspecified: Secondary | ICD-10-CM | POA: Diagnosis not present

## 2020-10-04 DIAGNOSIS — S82002A Unspecified fracture of left patella, initial encounter for closed fracture: Secondary | ICD-10-CM | POA: Diagnosis not present

## 2020-10-04 DIAGNOSIS — Z6838 Body mass index (BMI) 38.0-38.9, adult: Secondary | ICD-10-CM | POA: Diagnosis not present

## 2020-10-04 DIAGNOSIS — Z125 Encounter for screening for malignant neoplasm of prostate: Secondary | ICD-10-CM | POA: Diagnosis not present

## 2020-10-04 DIAGNOSIS — I130 Hypertensive heart and chronic kidney disease with heart failure and stage 1 through stage 4 chronic kidney disease, or unspecified chronic kidney disease: Secondary | ICD-10-CM | POA: Diagnosis not present

## 2020-10-04 DIAGNOSIS — M25512 Pain in left shoulder: Secondary | ICD-10-CM | POA: Diagnosis not present

## 2020-10-04 DIAGNOSIS — F325 Major depressive disorder, single episode, in full remission: Secondary | ICD-10-CM | POA: Diagnosis not present

## 2020-10-04 DIAGNOSIS — R739 Hyperglycemia, unspecified: Secondary | ICD-10-CM | POA: Diagnosis not present

## 2020-10-10 DIAGNOSIS — M62522 Muscle wasting and atrophy, not elsewhere classified, left upper arm: Secondary | ICD-10-CM | POA: Diagnosis not present

## 2020-10-10 DIAGNOSIS — M75102 Unspecified rotator cuff tear or rupture of left shoulder, not specified as traumatic: Secondary | ICD-10-CM | POA: Diagnosis not present

## 2020-10-10 DIAGNOSIS — M25512 Pain in left shoulder: Secondary | ICD-10-CM | POA: Diagnosis not present

## 2020-10-16 DIAGNOSIS — M62522 Muscle wasting and atrophy, not elsewhere classified, left upper arm: Secondary | ICD-10-CM | POA: Diagnosis not present

## 2020-10-16 DIAGNOSIS — M25512 Pain in left shoulder: Secondary | ICD-10-CM | POA: Diagnosis not present

## 2020-10-16 DIAGNOSIS — M75102 Unspecified rotator cuff tear or rupture of left shoulder, not specified as traumatic: Secondary | ICD-10-CM | POA: Diagnosis not present

## 2020-10-18 DIAGNOSIS — M25512 Pain in left shoulder: Secondary | ICD-10-CM | POA: Diagnosis not present

## 2020-10-18 DIAGNOSIS — M75102 Unspecified rotator cuff tear or rupture of left shoulder, not specified as traumatic: Secondary | ICD-10-CM | POA: Diagnosis not present

## 2020-10-18 DIAGNOSIS — M62522 Muscle wasting and atrophy, not elsewhere classified, left upper arm: Secondary | ICD-10-CM | POA: Diagnosis not present

## 2020-10-29 DIAGNOSIS — Z4789 Encounter for other orthopedic aftercare: Secondary | ICD-10-CM | POA: Diagnosis not present

## 2020-10-31 DIAGNOSIS — Z4789 Encounter for other orthopedic aftercare: Secondary | ICD-10-CM | POA: Diagnosis not present

## 2020-11-06 DIAGNOSIS — Z4789 Encounter for other orthopedic aftercare: Secondary | ICD-10-CM | POA: Diagnosis not present

## 2020-12-11 DIAGNOSIS — H2513 Age-related nuclear cataract, bilateral: Secondary | ICD-10-CM | POA: Diagnosis not present

## 2020-12-11 DIAGNOSIS — Z4789 Encounter for other orthopedic aftercare: Secondary | ICD-10-CM | POA: Diagnosis not present

## 2020-12-11 DIAGNOSIS — H43811 Vitreous degeneration, right eye: Secondary | ICD-10-CM | POA: Diagnosis not present

## 2020-12-11 DIAGNOSIS — H43391 Other vitreous opacities, right eye: Secondary | ICD-10-CM | POA: Diagnosis not present

## 2020-12-11 DIAGNOSIS — H1013 Acute atopic conjunctivitis, bilateral: Secondary | ICD-10-CM | POA: Diagnosis not present

## 2021-01-24 DIAGNOSIS — L209 Atopic dermatitis, unspecified: Secondary | ICD-10-CM | POA: Diagnosis not present

## 2021-01-28 DIAGNOSIS — H43391 Other vitreous opacities, right eye: Secondary | ICD-10-CM | POA: Diagnosis not present

## 2021-01-28 DIAGNOSIS — H43811 Vitreous degeneration, right eye: Secondary | ICD-10-CM | POA: Diagnosis not present

## 2021-01-28 DIAGNOSIS — H2513 Age-related nuclear cataract, bilateral: Secondary | ICD-10-CM | POA: Diagnosis not present

## 2021-02-28 DIAGNOSIS — M25572 Pain in left ankle and joints of left foot: Secondary | ICD-10-CM | POA: Diagnosis not present

## 2021-02-28 DIAGNOSIS — S93402A Sprain of unspecified ligament of left ankle, initial encounter: Secondary | ICD-10-CM | POA: Diagnosis not present

## 2021-02-28 DIAGNOSIS — S99912A Unspecified injury of left ankle, initial encounter: Secondary | ICD-10-CM | POA: Diagnosis not present

## 2021-04-04 DIAGNOSIS — Z6838 Body mass index (BMI) 38.0-38.9, adult: Secondary | ICD-10-CM | POA: Diagnosis not present

## 2021-04-04 DIAGNOSIS — E785 Hyperlipidemia, unspecified: Secondary | ICD-10-CM | POA: Diagnosis not present

## 2021-04-04 DIAGNOSIS — M109 Gout, unspecified: Secondary | ICD-10-CM | POA: Diagnosis not present

## 2021-04-04 DIAGNOSIS — F325 Major depressive disorder, single episode, in full remission: Secondary | ICD-10-CM | POA: Diagnosis not present

## 2021-04-04 DIAGNOSIS — I130 Hypertensive heart and chronic kidney disease with heart failure and stage 1 through stage 4 chronic kidney disease, or unspecified chronic kidney disease: Secondary | ICD-10-CM | POA: Diagnosis not present

## 2021-04-04 DIAGNOSIS — R635 Abnormal weight gain: Secondary | ICD-10-CM | POA: Diagnosis not present

## 2021-04-04 DIAGNOSIS — M2550 Pain in unspecified joint: Secondary | ICD-10-CM | POA: Diagnosis not present

## 2021-04-04 DIAGNOSIS — R739 Hyperglycemia, unspecified: Secondary | ICD-10-CM | POA: Diagnosis not present

## 2021-04-04 DIAGNOSIS — N182 Chronic kidney disease, stage 2 (mild): Secondary | ICD-10-CM | POA: Diagnosis not present

## 2021-04-04 DIAGNOSIS — I69351 Hemiplegia and hemiparesis following cerebral infarction affecting right dominant side: Secondary | ICD-10-CM | POA: Diagnosis not present

## 2021-04-04 DIAGNOSIS — Z8673 Personal history of transient ischemic attack (TIA), and cerebral infarction without residual deficits: Secondary | ICD-10-CM | POA: Diagnosis not present

## 2021-05-29 DIAGNOSIS — R400 Somnolence: Secondary | ICD-10-CM | POA: Diagnosis not present

## 2021-05-29 DIAGNOSIS — Z79899 Other long term (current) drug therapy: Secondary | ICD-10-CM | POA: Diagnosis not present

## 2021-05-29 DIAGNOSIS — Z7902 Long term (current) use of antithrombotics/antiplatelets: Secondary | ICD-10-CM | POA: Diagnosis not present

## 2021-05-29 DIAGNOSIS — R410 Disorientation, unspecified: Secondary | ICD-10-CM | POA: Diagnosis not present

## 2021-05-29 DIAGNOSIS — R29898 Other symptoms and signs involving the musculoskeletal system: Secondary | ICD-10-CM | POA: Diagnosis not present

## 2021-05-29 DIAGNOSIS — G459 Transient cerebral ischemic attack, unspecified: Secondary | ICD-10-CM | POA: Diagnosis not present

## 2021-05-29 DIAGNOSIS — I10 Essential (primary) hypertension: Secondary | ICD-10-CM | POA: Diagnosis not present

## 2021-05-29 DIAGNOSIS — R251 Tremor, unspecified: Secondary | ICD-10-CM | POA: Diagnosis not present

## 2021-05-29 DIAGNOSIS — R29818 Other symptoms and signs involving the nervous system: Secondary | ICD-10-CM | POA: Diagnosis not present

## 2021-05-29 DIAGNOSIS — I69351 Hemiplegia and hemiparesis following cerebral infarction affecting right dominant side: Secondary | ICD-10-CM | POA: Diagnosis not present

## 2021-05-29 DIAGNOSIS — I6503 Occlusion and stenosis of bilateral vertebral arteries: Secondary | ICD-10-CM | POA: Diagnosis not present

## 2021-05-29 DIAGNOSIS — R001 Bradycardia, unspecified: Secondary | ICD-10-CM | POA: Diagnosis not present

## 2021-05-29 DIAGNOSIS — I6523 Occlusion and stenosis of bilateral carotid arteries: Secondary | ICD-10-CM | POA: Diagnosis not present

## 2021-05-29 DIAGNOSIS — I63512 Cerebral infarction due to unspecified occlusion or stenosis of left middle cerebral artery: Secondary | ICD-10-CM | POA: Diagnosis not present

## 2021-05-29 DIAGNOSIS — I517 Cardiomegaly: Secondary | ICD-10-CM | POA: Diagnosis not present

## 2021-05-29 DIAGNOSIS — F32A Depression, unspecified: Secondary | ICD-10-CM | POA: Diagnosis not present

## 2021-05-29 DIAGNOSIS — Z9049 Acquired absence of other specified parts of digestive tract: Secondary | ICD-10-CM | POA: Diagnosis not present

## 2021-05-29 DIAGNOSIS — R404 Transient alteration of awareness: Secondary | ICD-10-CM | POA: Diagnosis not present

## 2021-05-29 DIAGNOSIS — I672 Cerebral atherosclerosis: Secondary | ICD-10-CM | POA: Diagnosis not present

## 2021-05-29 DIAGNOSIS — I639 Cerebral infarction, unspecified: Secondary | ICD-10-CM | POA: Diagnosis not present

## 2021-05-29 DIAGNOSIS — I771 Stricture of artery: Secondary | ICD-10-CM | POA: Diagnosis not present

## 2021-05-29 DIAGNOSIS — R531 Weakness: Secondary | ICD-10-CM | POA: Diagnosis not present

## 2021-05-29 DIAGNOSIS — R519 Headache, unspecified: Secondary | ICD-10-CM | POA: Diagnosis not present

## 2021-05-29 DIAGNOSIS — F419 Anxiety disorder, unspecified: Secondary | ICD-10-CM | POA: Diagnosis not present

## 2021-05-29 DIAGNOSIS — R2990 Unspecified symptoms and signs involving the nervous system: Secondary | ICD-10-CM | POA: Diagnosis not present

## 2021-05-29 DIAGNOSIS — Z7982 Long term (current) use of aspirin: Secondary | ICD-10-CM | POA: Diagnosis not present

## 2021-05-29 DIAGNOSIS — I6522 Occlusion and stenosis of left carotid artery: Secondary | ICD-10-CM | POA: Diagnosis not present

## 2021-05-29 DIAGNOSIS — E785 Hyperlipidemia, unspecified: Secondary | ICD-10-CM | POA: Diagnosis not present

## 2021-06-03 DIAGNOSIS — Z7902 Long term (current) use of antithrombotics/antiplatelets: Secondary | ICD-10-CM | POA: Diagnosis not present

## 2021-06-03 DIAGNOSIS — I748 Embolism and thrombosis of other arteries: Secondary | ICD-10-CM | POA: Diagnosis not present

## 2021-06-03 DIAGNOSIS — Z9889 Other specified postprocedural states: Secondary | ICD-10-CM | POA: Diagnosis not present

## 2021-06-03 DIAGNOSIS — Z7982 Long term (current) use of aspirin: Secondary | ICD-10-CM | POA: Diagnosis not present

## 2021-06-03 DIAGNOSIS — I708 Atherosclerosis of other arteries: Secondary | ICD-10-CM | POA: Diagnosis not present

## 2021-06-03 DIAGNOSIS — I10 Essential (primary) hypertension: Secondary | ICD-10-CM | POA: Diagnosis not present

## 2021-06-03 DIAGNOSIS — Z8673 Personal history of transient ischemic attack (TIA), and cerebral infarction without residual deficits: Secondary | ICD-10-CM | POA: Diagnosis not present

## 2021-06-03 DIAGNOSIS — I6522 Occlusion and stenosis of left carotid artery: Secondary | ICD-10-CM | POA: Diagnosis not present

## 2021-06-03 DIAGNOSIS — I639 Cerebral infarction, unspecified: Secondary | ICD-10-CM | POA: Diagnosis not present

## 2021-06-12 DIAGNOSIS — Z7689 Persons encountering health services in other specified circumstances: Secondary | ICD-10-CM | POA: Diagnosis not present

## 2021-06-12 DIAGNOSIS — Z8673 Personal history of transient ischemic attack (TIA), and cerebral infarction without residual deficits: Secondary | ICD-10-CM | POA: Diagnosis not present

## 2021-06-12 DIAGNOSIS — I6522 Occlusion and stenosis of left carotid artery: Secondary | ICD-10-CM | POA: Diagnosis not present

## 2021-06-12 DIAGNOSIS — Z79899 Other long term (current) drug therapy: Secondary | ICD-10-CM | POA: Diagnosis not present

## 2021-06-19 DIAGNOSIS — Z8673 Personal history of transient ischemic attack (TIA), and cerebral infarction without residual deficits: Secondary | ICD-10-CM | POA: Diagnosis not present

## 2021-07-05 DIAGNOSIS — I10 Essential (primary) hypertension: Secondary | ICD-10-CM | POA: Diagnosis not present

## 2021-07-05 DIAGNOSIS — I6523 Occlusion and stenosis of bilateral carotid arteries: Secondary | ICD-10-CM | POA: Diagnosis not present

## 2021-07-05 DIAGNOSIS — Z9889 Other specified postprocedural states: Secondary | ICD-10-CM | POA: Diagnosis not present

## 2021-07-30 DIAGNOSIS — E785 Hyperlipidemia, unspecified: Secondary | ICD-10-CM | POA: Diagnosis not present

## 2021-07-30 DIAGNOSIS — Z9181 History of falling: Secondary | ICD-10-CM | POA: Diagnosis not present

## 2021-07-30 DIAGNOSIS — Z6838 Body mass index (BMI) 38.0-38.9, adult: Secondary | ICD-10-CM | POA: Diagnosis not present

## 2021-07-30 DIAGNOSIS — Z0000 Encounter for general adult medical examination without abnormal findings: Secondary | ICD-10-CM | POA: Diagnosis not present

## 2021-07-30 DIAGNOSIS — Z1331 Encounter for screening for depression: Secondary | ICD-10-CM | POA: Diagnosis not present

## 2021-07-30 DIAGNOSIS — E669 Obesity, unspecified: Secondary | ICD-10-CM | POA: Diagnosis not present

## 2023-09-17 DIAGNOSIS — Z1212 Encounter for screening for malignant neoplasm of rectum: Secondary | ICD-10-CM | POA: Diagnosis not present

## 2023-09-17 DIAGNOSIS — Z1211 Encounter for screening for malignant neoplasm of colon: Secondary | ICD-10-CM | POA: Diagnosis not present

## 2023-10-29 DIAGNOSIS — Z8673 Personal history of transient ischemic attack (TIA), and cerebral infarction without residual deficits: Secondary | ICD-10-CM | POA: Diagnosis not present

## 2023-10-29 DIAGNOSIS — R251 Tremor, unspecified: Secondary | ICD-10-CM | POA: Diagnosis not present

## 2023-10-29 DIAGNOSIS — I739 Peripheral vascular disease, unspecified: Secondary | ICD-10-CM | POA: Diagnosis not present

## 2023-11-04 DIAGNOSIS — E66812 Obesity, class 2: Secondary | ICD-10-CM | POA: Diagnosis not present

## 2023-11-04 DIAGNOSIS — Z6838 Body mass index (BMI) 38.0-38.9, adult: Secondary | ICD-10-CM | POA: Diagnosis not present

## 2023-11-04 DIAGNOSIS — N182 Chronic kidney disease, stage 2 (mild): Secondary | ICD-10-CM | POA: Diagnosis not present

## 2023-11-04 DIAGNOSIS — I69351 Hemiplegia and hemiparesis following cerebral infarction affecting right dominant side: Secondary | ICD-10-CM | POA: Diagnosis not present

## 2023-11-04 DIAGNOSIS — R7303 Prediabetes: Secondary | ICD-10-CM | POA: Diagnosis not present

## 2023-11-04 DIAGNOSIS — M109 Gout, unspecified: Secondary | ICD-10-CM | POA: Diagnosis not present

## 2023-11-04 DIAGNOSIS — Z789 Other specified health status: Secondary | ICD-10-CM | POA: Diagnosis not present

## 2023-11-04 DIAGNOSIS — I10 Essential (primary) hypertension: Secondary | ICD-10-CM | POA: Diagnosis not present

## 2023-11-04 DIAGNOSIS — F419 Anxiety disorder, unspecified: Secondary | ICD-10-CM | POA: Diagnosis not present

## 2023-11-04 DIAGNOSIS — R5383 Other fatigue: Secondary | ICD-10-CM | POA: Diagnosis not present

## 2023-11-04 DIAGNOSIS — Z8673 Personal history of transient ischemic attack (TIA), and cerebral infarction without residual deficits: Secondary | ICD-10-CM | POA: Diagnosis not present

## 2023-11-04 DIAGNOSIS — E785 Hyperlipidemia, unspecified: Secondary | ICD-10-CM | POA: Diagnosis not present

## 2023-11-11 DIAGNOSIS — I672 Cerebral atherosclerosis: Secondary | ICD-10-CM | POA: Diagnosis not present

## 2023-11-11 DIAGNOSIS — I6521 Occlusion and stenosis of right carotid artery: Secondary | ICD-10-CM | POA: Diagnosis not present

## 2023-11-11 DIAGNOSIS — I700 Atherosclerosis of aorta: Secondary | ICD-10-CM | POA: Diagnosis not present

## 2023-11-11 DIAGNOSIS — I6503 Occlusion and stenosis of bilateral vertebral arteries: Secondary | ICD-10-CM | POA: Diagnosis not present

## 2023-11-11 DIAGNOSIS — I6509 Occlusion and stenosis of unspecified vertebral artery: Secondary | ICD-10-CM | POA: Diagnosis not present

## 2023-12-17 DIAGNOSIS — R067 Sneezing: Secondary | ICD-10-CM | POA: Diagnosis not present

## 2023-12-17 DIAGNOSIS — J309 Allergic rhinitis, unspecified: Secondary | ICD-10-CM | POA: Diagnosis not present

## 2024-01-07 DIAGNOSIS — R202 Paresthesia of skin: Secondary | ICD-10-CM | POA: Diagnosis not present

## 2024-01-07 DIAGNOSIS — I6782 Cerebral ischemia: Secondary | ICD-10-CM | POA: Diagnosis not present

## 2024-01-07 DIAGNOSIS — R9089 Other abnormal findings on diagnostic imaging of central nervous system: Secondary | ICD-10-CM | POA: Diagnosis not present

## 2024-01-07 DIAGNOSIS — M48061 Spinal stenosis, lumbar region without neurogenic claudication: Secondary | ICD-10-CM | POA: Diagnosis not present

## 2024-01-07 DIAGNOSIS — M4726 Other spondylosis with radiculopathy, lumbar region: Secondary | ICD-10-CM | POA: Diagnosis not present

## 2024-01-07 DIAGNOSIS — M5116 Intervertebral disc disorders with radiculopathy, lumbar region: Secondary | ICD-10-CM | POA: Diagnosis not present

## 2024-01-12 DIAGNOSIS — I6523 Occlusion and stenosis of bilateral carotid arteries: Secondary | ICD-10-CM | POA: Diagnosis not present

## 2024-01-12 DIAGNOSIS — I69359 Hemiplegia and hemiparesis following cerebral infarction affecting unspecified side: Secondary | ICD-10-CM | POA: Diagnosis not present

## 2024-01-31 DIAGNOSIS — L237 Allergic contact dermatitis due to plants, except food: Secondary | ICD-10-CM | POA: Diagnosis not present

## 2024-02-09 DIAGNOSIS — R7303 Prediabetes: Secondary | ICD-10-CM | POA: Diagnosis not present

## 2024-02-09 DIAGNOSIS — Z789 Other specified health status: Secondary | ICD-10-CM | POA: Diagnosis not present

## 2024-02-09 DIAGNOSIS — M109 Gout, unspecified: Secondary | ICD-10-CM | POA: Diagnosis not present

## 2024-02-09 DIAGNOSIS — E66812 Obesity, class 2: Secondary | ICD-10-CM | POA: Diagnosis not present

## 2024-02-09 DIAGNOSIS — E785 Hyperlipidemia, unspecified: Secondary | ICD-10-CM | POA: Diagnosis not present

## 2024-02-09 DIAGNOSIS — Z6838 Body mass index (BMI) 38.0-38.9, adult: Secondary | ICD-10-CM | POA: Diagnosis not present

## 2024-02-09 DIAGNOSIS — N182 Chronic kidney disease, stage 2 (mild): Secondary | ICD-10-CM | POA: Diagnosis not present

## 2024-02-09 DIAGNOSIS — I10 Essential (primary) hypertension: Secondary | ICD-10-CM | POA: Diagnosis not present

## 2024-04-21 DIAGNOSIS — S336XXA Sprain of sacroiliac joint, initial encounter: Secondary | ICD-10-CM | POA: Diagnosis not present

## 2024-04-21 DIAGNOSIS — M9903 Segmental and somatic dysfunction of lumbar region: Secondary | ICD-10-CM | POA: Diagnosis not present

## 2024-04-21 DIAGNOSIS — M7918 Myalgia, other site: Secondary | ICD-10-CM | POA: Diagnosis not present

## 2024-04-21 DIAGNOSIS — M9902 Segmental and somatic dysfunction of thoracic region: Secondary | ICD-10-CM | POA: Diagnosis not present

## 2024-04-21 DIAGNOSIS — S39012A Strain of muscle, fascia and tendon of lower back, initial encounter: Secondary | ICD-10-CM | POA: Diagnosis not present

## 2024-04-21 DIAGNOSIS — M9905 Segmental and somatic dysfunction of pelvic region: Secondary | ICD-10-CM | POA: Diagnosis not present

## 2024-05-23 DIAGNOSIS — R251 Tremor, unspecified: Secondary | ICD-10-CM | POA: Diagnosis not present

## 2024-05-23 DIAGNOSIS — Z8673 Personal history of transient ischemic attack (TIA), and cerebral infarction without residual deficits: Secondary | ICD-10-CM | POA: Diagnosis not present

## 2024-05-23 DIAGNOSIS — I739 Peripheral vascular disease, unspecified: Secondary | ICD-10-CM | POA: Diagnosis not present

## 2024-06-29 DIAGNOSIS — M25562 Pain in left knee: Secondary | ICD-10-CM | POA: Diagnosis not present

## 2024-06-29 DIAGNOSIS — M1712 Unilateral primary osteoarthritis, left knee: Secondary | ICD-10-CM | POA: Diagnosis not present

## 2024-07-04 DIAGNOSIS — M25562 Pain in left knee: Secondary | ICD-10-CM | POA: Diagnosis not present

## 2024-07-04 DIAGNOSIS — R2689 Other abnormalities of gait and mobility: Secondary | ICD-10-CM | POA: Diagnosis not present

## 2024-07-04 DIAGNOSIS — M62552 Muscle wasting and atrophy, not elsewhere classified, left thigh: Secondary | ICD-10-CM | POA: Diagnosis not present

## 2024-07-11 DIAGNOSIS — M25562 Pain in left knee: Secondary | ICD-10-CM | POA: Diagnosis not present

## 2024-07-11 DIAGNOSIS — R2689 Other abnormalities of gait and mobility: Secondary | ICD-10-CM | POA: Diagnosis not present

## 2024-07-11 DIAGNOSIS — M62552 Muscle wasting and atrophy, not elsewhere classified, left thigh: Secondary | ICD-10-CM | POA: Diagnosis not present

## 2024-07-13 DIAGNOSIS — R2689 Other abnormalities of gait and mobility: Secondary | ICD-10-CM | POA: Diagnosis not present

## 2024-07-13 DIAGNOSIS — M62552 Muscle wasting and atrophy, not elsewhere classified, left thigh: Secondary | ICD-10-CM | POA: Diagnosis not present

## 2024-07-13 DIAGNOSIS — M25562 Pain in left knee: Secondary | ICD-10-CM | POA: Diagnosis not present

## 2024-07-18 DIAGNOSIS — R2689 Other abnormalities of gait and mobility: Secondary | ICD-10-CM | POA: Diagnosis not present

## 2024-07-18 DIAGNOSIS — M62552 Muscle wasting and atrophy, not elsewhere classified, left thigh: Secondary | ICD-10-CM | POA: Diagnosis not present

## 2024-07-18 DIAGNOSIS — M25562 Pain in left knee: Secondary | ICD-10-CM | POA: Diagnosis not present

## 2024-07-28 DIAGNOSIS — M25562 Pain in left knee: Secondary | ICD-10-CM | POA: Diagnosis not present

## 2024-07-28 DIAGNOSIS — M62552 Muscle wasting and atrophy, not elsewhere classified, left thigh: Secondary | ICD-10-CM | POA: Diagnosis not present

## 2024-07-28 DIAGNOSIS — R2689 Other abnormalities of gait and mobility: Secondary | ICD-10-CM | POA: Diagnosis not present
